# Patient Record
Sex: Male | Born: 1965
Health system: Southern US, Community
[De-identification: ages and names within clinical notes are randomized; demographics above are authoritative.]

---

## 2019-08-21 ENCOUNTER — Other Ambulatory Visit: Payer: Self-pay | Admitting: Family Medicine

## 2019-08-21 ENCOUNTER — Ambulatory Visit
Admission: RE | Admit: 2019-08-21 | Discharge: 2019-08-21 | Disposition: A | Discharge status: Home / Self Care | Payer: Managed Care, Other (non HMO) | Source: Ambulatory Visit | Attending: Family Medicine | Admitting: Family Medicine

## 2019-08-21 DIAGNOSIS — R0989 Other specified symptoms and signs involving the circulatory and respiratory systems: Secondary | ICD-10-CM

## 2020-09-06 DIAGNOSIS — Z0000 Encounter for general adult medical examination without abnormal findings: Secondary | ICD-10-CM | POA: Diagnosis not present

## 2020-09-06 DIAGNOSIS — Z125 Encounter for screening for malignant neoplasm of prostate: Secondary | ICD-10-CM | POA: Diagnosis not present

## 2020-09-06 DIAGNOSIS — Z13220 Encounter for screening for lipoid disorders: Secondary | ICD-10-CM | POA: Diagnosis not present

## 2020-09-06 DIAGNOSIS — Z8719 Personal history of other diseases of the digestive system: Secondary | ICD-10-CM | POA: Diagnosis not present

## 2020-09-06 DIAGNOSIS — R7309 Other abnormal glucose: Secondary | ICD-10-CM | POA: Diagnosis not present

## 2020-10-04 DIAGNOSIS — N401 Enlarged prostate with lower urinary tract symptoms: Secondary | ICD-10-CM | POA: Diagnosis not present

## 2020-10-04 DIAGNOSIS — R251 Tremor, unspecified: Secondary | ICD-10-CM | POA: Diagnosis not present

## 2020-10-04 DIAGNOSIS — N50819 Testicular pain, unspecified: Secondary | ICD-10-CM | POA: Diagnosis not present

## 2020-10-05 ENCOUNTER — Other Ambulatory Visit: Payer: Self-pay | Admitting: Family Medicine

## 2020-10-05 DIAGNOSIS — N50819 Testicular pain, unspecified: Secondary | ICD-10-CM

## 2020-10-13 ENCOUNTER — Ambulatory Visit: Payer: BC Managed Care – PPO | Admitting: Internal Medicine

## 2020-10-13 ENCOUNTER — Other Ambulatory Visit: Payer: Self-pay

## 2020-10-13 ENCOUNTER — Encounter: Payer: Self-pay | Admitting: Internal Medicine

## 2020-10-13 VITALS — BP 130/80 | HR 101 | Ht 71.0 in | Wt 173.5 lb

## 2020-10-13 DIAGNOSIS — E0590 Thyrotoxicosis, unspecified without thyrotoxic crisis or storm: Secondary | ICD-10-CM

## 2020-10-13 MED ORDER — METHIMAZOLE 10 MG PO TABS: 10.0000 mg | ORAL_TABLET | Freq: Every day | ORAL | 1 refills | Status: DC

## 2020-10-13 MED ORDER — ATENOLOL 25 MG PO TABS: 25.0000 mg | ORAL_TABLET | Freq: Every day | ORAL | 1 refills | Status: DC

## 2020-10-13 NOTE — Progress Notes (Signed)
Name: Marcus Shea  MRN/ DOB: [id number], [birth year]    Age/ Sex: 55 y.o., male    PCP: London Pepper, MD   Reason for Endocrinology Evaluation: Hyperthyroidism     Date of Initial Endocrinology Evaluation: 10/13/2020     HPI: [name] is a 55 y.o. male with unremarkable  past medical history. The patient presented for initial endocrinology clinic visit on 10/13/2020 for consultative assistance with his Hyperthyroidism  He has been diagnosed with hyperthyroidism in 09/2020 with a suppressed TSH < 0.01 uIU/mL an elevated FT4 at 2.99 ng/dL and T3 at 333 ng/dL , he was having tremors and diarrhea at the time.   He was being evaluated for pain in the testicles, currently on Cipro  He was also having SOB and having to catch his breath more often   Denies local neck swelling or pain NO recent URI symptoms   He noted weight loss over the past 6 months  Diarrhea has resolved  Continues with tremors  Has palpitations   Denies prior exposure to radiation  Was on MVI but stopped  Has eye allergies   Wife with Breast cancer , mother with metastatic breast ca  He is a Financial planner     HISTORY:   Past Medical History: No past medical history on file.  Past Surgical History:    Social History:  reports that he has never smoked. He has quit using smokeless tobacco. He reports current alcohol use.  Family History: family history is not on file.   HOME MEDICATIONS: Allergies as of 10/13/2020   No Known Allergies     Medication List       Accurate as of October 13, 2020 11:42 AM. If you have any questions, ask your nurse or doctor.        STOP taking these medications   predniSONE 5 MG tablet Commonly known as: DELTASONE Stopped by: Dorita Sciara, MD     TAKE these medications   ciprofloxacin 500 MG tablet Commonly known as: CIPRO Take 500 mg by mouth daily with breakfast.   naproxen 500 MG tablet Commonly known as: NAPROSYN Take 500 mg by  mouth 2 (two) times daily.         REVIEW OF SYSTEMS: A comprehensive ROS was conducted with the patient and is negative except as per HPI     OBJECTIVE:  VS: BP 130/80   Pulse (!) 101   Ht 5\' 11"  (1.803 m)   Wt 173 lb 8 oz (78.7 kg)   SpO2 98%   BMI 24.20 kg/m    Wt Readings from Last 3 Encounters:  10/13/20 173 lb 8 oz (78.7 kg)     EXAM: General: Pt appears well and is in NAD  Eyes: External eye exam normal without stare, lid lag or exophthalmos.  EOM intact.  Neck: General: Supple without adenopathy. Thyroid: Thyroid size normal.  No goiter or nodules appreciated. No thyroid bruit.  Lungs: Clear with good BS bilat with no rales, rhonchi, or wheezes  Heart: Auscultation: Tachycardic  Abdomen: Normoactive bowel sounds, soft, nontender, without masses or organomegaly palpable  Extremities:  BL LE: No pretibial edema normal ROM and strength.  Skin: Hair: Texture and amount normal with gender appropriate distribution Skin Inspection: No rashes Skin Palpation: Skin temperature, texture, and thickness normal to palpation  Neuro: Cranial nerves: II - XII grossly intact  Motor: Normal strength throughout DTRs: 2+ and symmetric in UE without delay in relaxation phase  Mental  Status: Judgment, insight: Intact Orientation: Oriented to time, place, and person Mood and affect: No depression, anxiety, or agitation     DATA REVIEWED: 10/05/2020 TSH < 0.01 uIU/mL  FT4 2.99 ng/dL T3 333 ng/dL  BUN/Cr 14/0.81 GFR 103 WBC 5.4 H/H 14.5/42.9 PLT 298 Neut 54.4    ASSESSMENT/PLAN/RECOMMENDATIONS:   1. Hyperthyroidism:  -Patient is clinically hyperthyroid -No local neck symptoms -We discussed differential diagnosis of Graves' disease, versus toxic multinodular goiter, versus subacute thyroiditis. -We discussed starting beta-blocker, he has no personal history of asthma -We discussed that Graves' Disease is a result of an autoimmune condition involving the thyroid.     We discussed with pt the benefits of methimazole in the Tx of hyperthyroidism, as well as the possible side effects/complications of anti-thyroid drug Tx (specifically detailing the rare, but serious side effect of agranulocytosis). He was informed of need for regular thyroid function monitoring while on methimazole to ensure appropriate dosage without over-treatment. As well, we discussed the possible side effects of methimazole including the chance of rash, the small chance of liver irritation/juandice and the <=1 in 300-400 chance of sudden onset agranulocytosis.  We discussed importance of going to ED promptly (and stopping methimazole) if hewere to develop significant fever with severe sore throat of other evidence of acute infection.       We extensively discussed the various treatment options for hyperthyroidism and Graves disease including ablation therapy with radioactive iodine versus antithyroid drug treatment versus surgical therapy.  We recommended to the patient that we felt, at this time, that thionamides therapy would be most optimal.  We discussed the various possible benefits versus side effects of the various therapies.   I carefully explained to the patient that one of the consequences of I-131 ablation treatment would likely be permanent hypothyroidism which would require long-term replacement therapy with LT4.   Medications : Start methimazole 10 mg, 1 tablet daily Start atenolol 25 mg 1 times daily    Follow-up in 3 months Labs in 6 weeks    Signed electronically by: Mack Guise, MD  [facility] Endocrinology  [facility] [address] [place], [place] [address] Phone: [phone] FAX: [phone]   CC: London Pepper, MD [place] [address] Phone: [phone] Fax: [phone]   Return to Endocrinology clinic as below: Future Appointments  Date Time Provider [place]   10/18/2020  2:00 PM [facility] [place] [facility]

## 2020-10-13 NOTE — Patient Instructions (Signed)
We recommend that you follow these hyperthyroidism instructions at home:  1) Take Methimazole 10 mg once  a day  If you develop severe sore throat with high fevers OR develop unexplained yellowing of your skin, eyes, under your tongue, severe abdominal pain with nausea or vomiting --> then please get evaluated immediately.  2) Atenolol 25 mg one times a day 3) Get repeat thyroid labs 6 weeks .   It is ESSENTIAL to get follow-up labs to help avoid over or undertreatment of your hyperthyroidism - both of which can be dangerous to your health.

## 2020-10-18 ENCOUNTER — Ambulatory Visit
Admission: RE | Admit: 2020-10-18 | Discharge: 2020-10-18 | Disposition: A | Discharge status: Home / Self Care | Payer: Managed Care, Other (non HMO) | Source: Ambulatory Visit | Attending: Family Medicine | Admitting: Family Medicine

## 2020-10-18 DIAGNOSIS — N50819 Testicular pain, unspecified: Secondary | ICD-10-CM

## 2020-11-26 ENCOUNTER — Other Ambulatory Visit: Payer: Self-pay

## 2020-11-26 ENCOUNTER — Other Ambulatory Visit (INDEPENDENT_AMBULATORY_CARE_PROVIDER_SITE_OTHER): Payer: BC Managed Care – PPO

## 2020-11-26 DIAGNOSIS — E0590 Thyrotoxicosis, unspecified without thyrotoxic crisis or storm: Secondary | ICD-10-CM

## 2020-11-26 LAB — T4, FREE: Free T4: 0.49 ng/dL — ABNORMAL LOW (ref 0.60–1.60)

## 2020-11-26 LAB — TSH: TSH: 0.05 u[IU]/mL — ABNORMAL LOW (ref 0.35–4.50)

## 2020-11-28 LAB — TRAB (TSH RECEPTOR BINDING ANTIBODY): TRAB: 19.14 IU/L — ABNORMAL HIGH (ref <=2.00)

## 2020-11-29 ENCOUNTER — Telehealth: Payer: Self-pay | Admitting: Internal Medicine

## 2020-11-29 NOTE — Telephone Encounter (Signed)
Please let the pt know that the cause of overactive thyroid is Graves' disease, which is an immune condition.    Please ask him to reduce Methimazole from one table to HALF a tablet from now on     Thanks   Benitez, MD  [facility] Endocrinology  [facility] [address] [place], [place] [address] Phone: [phone] FAX: [phone]

## 2020-11-29 NOTE — Telephone Encounter (Signed)
Spoken to patient and notified Dr Shamleffer's comments. Verbalized understanding.   

## 2020-11-30 DIAGNOSIS — D2930 Benign neoplasm of unspecified epididymis: Secondary | ICD-10-CM | POA: Diagnosis not present

## 2020-11-30 DIAGNOSIS — R102 Pelvic and perineal pain: Secondary | ICD-10-CM | POA: Diagnosis not present

## 2020-12-06 DIAGNOSIS — R102 Pelvic and perineal pain: Secondary | ICD-10-CM | POA: Diagnosis not present

## 2020-12-06 DIAGNOSIS — M6289 Other specified disorders of muscle: Secondary | ICD-10-CM | POA: Diagnosis not present

## 2020-12-06 DIAGNOSIS — M6281 Muscle weakness (generalized): Secondary | ICD-10-CM | POA: Diagnosis not present

## 2020-12-06 DIAGNOSIS — M62838 Other muscle spasm: Secondary | ICD-10-CM | POA: Diagnosis not present

## 2020-12-17 ENCOUNTER — Telehealth: Payer: Self-pay | Admitting: Internal Medicine

## 2020-12-17 DIAGNOSIS — M6289 Other specified disorders of muscle: Secondary | ICD-10-CM | POA: Diagnosis not present

## 2020-12-17 DIAGNOSIS — R102 Pelvic and perineal pain: Secondary | ICD-10-CM | POA: Diagnosis not present

## 2020-12-17 DIAGNOSIS — M62838 Other muscle spasm: Secondary | ICD-10-CM | POA: Diagnosis not present

## 2020-12-17 DIAGNOSIS — M6281 Muscle weakness (generalized): Secondary | ICD-10-CM | POA: Diagnosis not present

## 2020-12-17 MED ORDER — ATENOLOL 25 MG PO TABS: 25.0000 mg | ORAL_TABLET | Freq: Every day | ORAL | 1 refills | Status: DC

## 2020-12-17 MED ORDER — METHIMAZOLE 10 MG PO TABS: 5.0000 mg | ORAL_TABLET | Freq: Every day | ORAL | 1 refills | Status: DC

## 2020-12-17 NOTE — Telephone Encounter (Signed)
Refill sent.

## 2020-12-17 NOTE — Telephone Encounter (Signed)
Patient called to advise that his pharmacy preference is:  [facility]  [address]  Patient states needs new Methimazole RX (reflecting new dosage) and Atenolol sent to the new pharmacy

## 2021-01-19 ENCOUNTER — Other Ambulatory Visit: Payer: Self-pay

## 2021-01-19 ENCOUNTER — Ambulatory Visit: Payer: BC Managed Care – PPO | Admitting: Internal Medicine

## 2021-01-19 ENCOUNTER — Encounter: Payer: Self-pay | Admitting: Internal Medicine

## 2021-01-19 VITALS — BP 124/76 | HR 58 | Resp 96 | Ht 71.0 in | Wt 175.0 lb

## 2021-01-19 DIAGNOSIS — E0500 Thyrotoxicosis with diffuse goiter without thyrotoxic crisis or storm: Secondary | ICD-10-CM

## 2021-01-19 DIAGNOSIS — E0590 Thyrotoxicosis, unspecified without thyrotoxic crisis or storm: Secondary | ICD-10-CM

## 2021-01-19 NOTE — Progress Notes (Signed)
Name: Marcus Shea  MRN/ DOB: [id number], Oct 04, 1965    Age/ Sex: 55 y.o., male     PCP: London Pepper, MD   Reason for Endocrinology Evaluation: Hyperthyroidism     Initial Endocrinology Clinic Visit: 10/13/2020    PATIENT IDENTIFIER: [name] is a 55 y.o., male with a past medical history of Hyperthyroidism. He has followed with [place] [facility] since 10/13/2020 for consultative assistance with management of his Hyperthyroidism.   HISTORICAL SUMMARY:   He has been diagnosed with hyperthyroidism in 09/2020 with a suppressed TSH < 0.01 uIU/mL an elevated FT4 at 2.99 ng/dL and T3 at 333 ng/dL , he was having tremors and diarrhea at the time.    He was started on methimazole and atenolol in 09/2020 TR AB was elevated at 19.14 IU/L    Wife with Breast cancer , mother with metastatic breast ca   He is a Financial planner  SUBJECTIVE:    Today (01/19/2021):  Mr. [name] is here for a follow-up on hyperthyroidism .    Weight has been stable  Denies abdominal pain or vomiting  Denies constipation or diarrhea  Denies local neck swelling  Last eye exam was several years   Methimazole 10 mg, half a tablet daily  Atenolol 25 mg daily      HISTORY:  Past Medical History: No past medical history on file. Past Surgical History:  Social History:  reports that he has never smoked. He has quit using smokeless tobacco. He reports current alcohol use. No history on file for drug use. Family History:  Family History  Problem Relation Age of Onset   Breast cancer Mother    Diabetes Father      HOME MEDICATIONS: Allergies as of 01/19/2021   No Known Allergies      Medication List        Accurate as of January 19, 2021  4:14 PM. If you have any questions, ask your nurse or doctor.          atenolol 25 MG tablet Commonly known as: TENORMIN Take 1 tablet (25 mg total) by mouth daily.   ciprofloxacin 500 MG tablet Commonly known as: CIPRO Take 500 mg  by mouth daily with breakfast.   methimazole 10 MG tablet Commonly known as: TAPAZOLE Take 0.5 tablets (5 mg total) by mouth daily.   naproxen 500 MG tablet Commonly known as: NAPROSYN Take 500 mg by mouth 2 (two) times daily.          OBJECTIVE:   PHYSICAL EXAM: VS: BP 124/76   Pulse (!) 58   Resp (!) 96   Ht 5\' 11"  (1.803 m)   Wt 175 lb (79.4 kg)   BMI 24.41 kg/m    EXAM: General: Pt appears well and is in NAD  Eyes: External eye exam normal without stare, lid lag or exophthalmos.  EOM intact.  PERRL.  Neck: General: Supple without adenopathy. Thyroid: Thyroid size normal.  No goiter or nodules appreciated. No thyroid bruit.  Lungs: Clear with good BS bilat with no rales, rhonchi, or wheezes  Heart: Auscultation: RRR.  Abdomen: Normoactive bowel sounds, soft, nontender, without masses or organomegaly palpable  Extremities:  BL LE: No pretibial edema normal ROM and strength.  Mental Status: Judgment, insight: Intact Orientation: Oriented to time, place, and person Mood and affect: No depression, anxiety, or agitation     DATA REVIEWED:  Results for BIRD, SWETZ (MRN [id number]) as of 01/21/2021 09:12  Ref. Range 01/19/2021 16:36  Sodium Latest Ref Range: 135 - 145 mEq/L 138  Potassium Latest Ref Range: 3.5 - 5.1 mEq/L 4.3  Chloride Latest Ref Range: 96 - 112 mEq/L 102  CO2 Latest Ref Range: 19 - 32 mEq/L 28  Glucose Latest Ref Range: 70 - 99 mg/dL 80  BUN Latest Ref Range: 6 - 23 mg/dL 14  Creatinine Latest Ref Range: 0.40 - 1.50 mg/dL 1.07  Calcium Latest Ref Range: 8.4 - 10.5 mg/dL 9.6  Alkaline Phosphatase Latest Ref Range: 39 - 117 U/L 116  Albumin Latest Ref Range: 3.5 - 5.2 g/dL 4.6  AST Latest Ref Range: 0 - 37 U/L 23  ALT Latest Ref Range: 0 - 53 U/L 17  Total Protein Latest Ref Range: 6.0 - 8.3 g/dL 7.7  Total Bilirubin Latest Ref Range: 0.2 - 1.2 mg/dL 0.4  GFR Latest Ref Range: >60.00 mL/min 77.39  WBC Latest Ref Range: 4.0 - 10.5 K/uL 6.3  RBC  Latest Ref Range: 4.22 - 5.81 Mil/uL 4.96  Hemoglobin Latest Ref Range: 13.0 - 17.0 g/dL 15.5  HCT Latest Ref Range: 39.0 - 52.0 % 45.4  MCV Latest Ref Range: 78.0 - 100.0 fl 91.5  MCHC Latest Ref Range: 30.0 - 36.0 g/dL 34.1  RDW Latest Ref Range: 11.5 - 15.5 % 15.3  Platelets Latest Ref Range: 150.0 - 400.0 K/uL 270.0  Neutrophils Latest Ref Range: 43.0 - 77.0 % 58.7  Lymphocytes Latest Ref Range: 12.0 - 46.0 % 29.0  Monocytes Relative Latest Ref Range: 3.0 - 12.0 % 8.1  Eosinophil Latest Ref Range: 0.0 - 5.0 % 3.8  Basophil Latest Ref Range: 0.0 - 3.0 % 0.4  NEUT# Latest Ref Range: 1.4 - 7.7 K/uL 3.7  Lymphocyte # Latest Ref Range: 0.7 - 4.0 K/uL 1.8  Monocyte # Latest Ref Range: 0.1 - 1.0 K/uL 0.5  Eosinophils Absolute Latest Ref Range: 0.0 - 0.7 K/uL 0.2  Basophils Absolute Latest Ref Range: 0.0 - 0.1 K/uL 0.0  TSH Latest Ref Range: 0.35 - 5.50 uIU/mL 4.81  T4,Free(Direct) Latest Ref Range: 0.60 - 1.60 ng/dL 0.55 (L)     ASSESSMENT / PLAN / RECOMMENDATIONS:   Hyperthyroidism Secondary to Graves' Disease   - Pt is clinically euthyroid except for the occasional "jittery" sensation that he has noted since reducing methimazole dose - Biochemically he is hypothyroid, will reduce the methimazole dose as below   Medications  Stop Methimazole 10 mg  Start Methimazole 5 mg, take 1 tablet Monday through Friday and half a tablet on Saturday and Sundays  Stop Atenolol 25 mg daily    2. Graves' Disease:   - No extra thyroidal manifestations of Graves' disease   Addendum: discussed lab results with pt on 01/21/2021 at 9:15 am     Signed electronically by: Mack Guise, MD  [facility] Endocrinology  [facility] [address] [place], [place] [address] Phone: [phone] FAX: [phone]      CC: London Pepper, MD [address] [place] [address] Phone: [phone]  Fax: [phone]   Return to  Endocrinology clinic as below: No future appointments.

## 2021-01-20 LAB — COMPREHENSIVE METABOLIC PANEL
ALT: 17 U/L (ref 0–53)
AST: 23 U/L (ref 0–37)
Albumin: 4.6 g/dL (ref 3.5–5.2)
Alkaline Phosphatase: 116 U/L (ref 39–117)
BUN: 14 mg/dL (ref 6–23)
CO2: 28 mEq/L (ref 19–32)
Calcium: 9.6 mg/dL (ref 8.4–10.5)
Chloride: 102 mEq/L (ref 96–112)
Creatinine, Ser: 1.07 mg/dL (ref 0.40–1.50)
GFR: 77.39 mL/min (ref >60.00)
Glucose, Bld: 80 mg/dL (ref 70–99)
Potassium: 4.3 mEq/L (ref 3.5–5.1)
Sodium: 138 mEq/L (ref 135–145)
Total Bilirubin: 0.4 mg/dL (ref 0.2–1.2)
Total Protein: 7.7 g/dL (ref 6.0–8.3)

## 2021-01-20 LAB — CBC WITH DIFFERENTIAL/PLATELET
Basophils Absolute: 0 10*3/uL (ref 0.0–0.1)
Basophils Relative: 0.4 % (ref 0.0–3.0)
Eosinophils Absolute: 0.2 10*3/uL (ref 0.0–0.7)
Eosinophils Relative: 3.8 % (ref 0.0–5.0)
HCT: 45.4 % (ref 39.0–52.0)
Hemoglobin: 15.5 g/dL (ref 13.0–17.0)
Lymphocytes Relative: 29 % (ref 12.0–46.0)
Lymphs Abs: 1.8 10*3/uL (ref 0.7–4.0)
MCHC: 34.1 g/dL (ref 30.0–36.0)
MCV: 91.5 fl (ref 78.0–100.0)
Monocytes Absolute: 0.5 10*3/uL (ref 0.1–1.0)
Monocytes Relative: 8.1 % (ref 3.0–12.0)
Neutro Abs: 3.7 10*3/uL (ref 1.4–7.7)
Neutrophils Relative %: 58.7 % (ref 43.0–77.0)
Platelets: 270 10*3/uL (ref 150.0–400.0)
RBC: 4.96 Mil/uL (ref 4.22–5.81)
RDW: 15.3 % (ref 11.5–15.5)
WBC: 6.3 10*3/uL (ref 4.0–10.5)

## 2021-01-20 LAB — TSH: TSH: 4.81 u[IU]/mL (ref 0.35–5.50)

## 2021-01-20 LAB — T4, FREE: Free T4: 0.55 ng/dL — ABNORMAL LOW (ref 0.60–1.60)

## 2021-01-21 MED ORDER — METHIMAZOLE 5 MG PO TABS: 5.0000 mg | ORAL_TABLET | ORAL | 1 refills | Status: DC

## 2021-01-26 ENCOUNTER — Other Ambulatory Visit: Payer: Self-pay | Admitting: Urology

## 2021-01-26 DIAGNOSIS — D2930 Benign neoplasm of unspecified epididymis: Secondary | ICD-10-CM

## 2021-02-09 ENCOUNTER — Ambulatory Visit
Admission: RE | Admit: 2021-02-09 | Discharge: 2021-02-09 | Disposition: A | Discharge status: Home / Self Care | Payer: BC Managed Care – PPO | Source: Ambulatory Visit | Attending: Urology | Admitting: Urology

## 2021-02-09 ENCOUNTER — Other Ambulatory Visit: Payer: Self-pay

## 2021-02-09 DIAGNOSIS — D2930 Benign neoplasm of unspecified epididymis: Secondary | ICD-10-CM

## 2021-02-09 DIAGNOSIS — D294 Benign neoplasm of scrotum: Secondary | ICD-10-CM | POA: Diagnosis not present

## 2021-02-09 DIAGNOSIS — D2931 Benign neoplasm of right epididymis: Secondary | ICD-10-CM | POA: Diagnosis not present

## 2021-03-07 DIAGNOSIS — L738 Other specified follicular disorders: Secondary | ICD-10-CM | POA: Diagnosis not present

## 2021-03-07 DIAGNOSIS — L821 Other seborrheic keratosis: Secondary | ICD-10-CM | POA: Diagnosis not present

## 2021-03-07 DIAGNOSIS — L570 Actinic keratosis: Secondary | ICD-10-CM | POA: Diagnosis not present

## 2021-03-25 ENCOUNTER — Telehealth: Payer: Self-pay | Admitting: Internal Medicine

## 2021-03-25 ENCOUNTER — Other Ambulatory Visit (INDEPENDENT_AMBULATORY_CARE_PROVIDER_SITE_OTHER): Payer: BC Managed Care – PPO

## 2021-03-25 ENCOUNTER — Other Ambulatory Visit: Payer: Self-pay

## 2021-03-25 DIAGNOSIS — E0590 Thyrotoxicosis, unspecified without thyrotoxic crisis or storm: Secondary | ICD-10-CM

## 2021-03-25 LAB — TSH: TSH: 10.2 u[IU]/mL — ABNORMAL HIGH (ref 0.35–5.50)

## 2021-03-25 LAB — T4, FREE: Free T4: 0.53 ng/dL — ABNORMAL LOW (ref 0.60–1.60)

## 2021-03-25 MED ORDER — METHIMAZOLE 5 MG PO TABS: 5.0000 mg | ORAL_TABLET | ORAL | 6 refills | Status: DC

## 2021-03-25 NOTE — Telephone Encounter (Signed)
Results for HALLETT, SOTER (MRN [id number]) as of 03/25/2021 17:35  Ref. Range 03/25/2021 15:00  TSH Latest Ref Range: 0.35 - 5.50 uIU/mL 10.20 (H)  T4,Free(Direct) Latest Ref Range: 0.60 - 1.60 ng/dL 0.53 (L)    Discussed lab results with the patient, he is now hypothyroid, we will reduce his methimazole as below     Methimazole 5 mg half a tablet Monday through Friday.  Skip Saturday and Sunday   Patient expressed understanding   Abby Nena Jordan, MD  [facility] Endocrinology  [facility] [address] [place], [place] [address] Phone: [phone] FAX: [phone]

## 2021-05-25 ENCOUNTER — Ambulatory Visit (INDEPENDENT_AMBULATORY_CARE_PROVIDER_SITE_OTHER): Payer: BC Managed Care – PPO | Admitting: Internal Medicine

## 2021-05-25 ENCOUNTER — Other Ambulatory Visit: Payer: Self-pay

## 2021-05-25 ENCOUNTER — Encounter: Payer: Self-pay | Admitting: Internal Medicine

## 2021-05-25 VITALS — BP 120/84 | HR 68 | Ht 71.0 in | Wt 169.2 lb

## 2021-05-25 DIAGNOSIS — E0500 Thyrotoxicosis with diffuse goiter without thyrotoxic crisis or storm: Secondary | ICD-10-CM | POA: Diagnosis not present

## 2021-05-25 DIAGNOSIS — E0590 Thyrotoxicosis, unspecified without thyrotoxic crisis or storm: Secondary | ICD-10-CM | POA: Diagnosis not present

## 2021-05-25 DIAGNOSIS — Z23 Encounter for immunization: Secondary | ICD-10-CM

## 2021-05-25 NOTE — Progress Notes (Signed)
Name: Marcus Shea  MRN/ DOB: [id number], 1966/02/17    Age/ Sex: 55 y.o., male     PCP: London Pepper, MD   Reason for Endocrinology Evaluation: Hyperthyroidism     Initial Endocrinology Clinic Visit: 10/13/2020    PATIENT IDENTIFIER: [name] is a 55 y.o., male with a past medical history of Hyperthyroidism. He has followed with [facility] since 10/13/2020 for consultative assistance with management of his Hyperthyroidism.   HISTORICAL SUMMARY:   He has been diagnosed with hyperthyroidism in 09/2020 with a suppressed TSH < 0.01 uIU/mL an elevated FT4 at 2.99 ng/dL and T3 at 333 ng/dL , he was having tremors and diarrhea at the time.    He was started on methimazole and atenolol in 09/2020 TRAb  was elevated at 19.14 IU/L    Wife with Breast cancer , mother with metastatic breast ca   He is a Financial planner  SUBJECTIVE:    Today (05/25/2021):  [name] is here for a follow-up on hyperthyroidism .    He has been noted with weight loss - intentional through fasting  Denies abdominal pain or vomiting  Denies constipation or diarrhea  Denies local neck swelling  Denies palpitations  Burning or itching of the eyes that he attributes to allergies    Methimazole 5 mg, half a tablet Monday through Friday     HISTORY:  Past Medical History: No past medical history on file. Past Surgical History:  Social History:  reports that he has never smoked. He has quit using smokeless tobacco. He reports current alcohol use. No history on file for drug use. Family History:  Family History  Problem Relation Age of Onset   Breast cancer Mother    Diabetes Father      HOME MEDICATIONS: Allergies as of 05/25/2021   No Known Allergies      Medication List        Accurate as of May 25, 2021  3:37 PM. If you have any questions, ask your nurse or doctor.          STOP taking these medications    ciprofloxacin 500 MG tablet Commonly  known as: CIPRO Stopped by: Dorita Sciara, MD       TAKE these medications    methimazole 5 MG tablet Commonly known as: TAPAZOLE Take 1 tablet (5 mg total) by mouth as directed. Half a tablet  Monday through Friday, one on weekend   naproxen 500 MG tablet Commonly known as: NAPROSYN Take 500 mg by mouth 2 (two) times daily.          OBJECTIVE:   PHYSICAL EXAM: VS: BP 120/84 (BP Location: Left Arm, Patient Position: Sitting, Cuff Size: Small)   Pulse 68   Ht 5\' 11"  (1.803 m)   Wt 169 lb 3.2 oz (76.7 kg)   SpO2 99%   BMI 23.60 kg/m    EXAM: General: Pt appears well and is in NAD  Eyes: External eye exam normal without stare, lid lag or exophthalmos.  EOM intact.  PERRL.  Neck: General: Supple without adenopathy. Thyroid: Thyroid size normal.  No goiter or nodules appreciated. No thyroid bruit.  Lungs: Clear with good BS bilat with no rales, rhonchi, or wheezes  Heart: Auscultation: RRR.  Abdomen: Normoactive bowel sounds, soft, nontender, without masses or organomegaly palpable  Extremities:  BL LE: No pretibial edema normal ROM and strength.  Mental Status: Judgment, insight: Intact Orientation: Oriented to time, place, and person Mood and  affect: No depression, anxiety, or agitation     DATA REVIEWED:  Results for Marcus, ROUT (MRN [id number]) as of 05/27/2021 12:21  Ref. Range 05/25/2021 15:54  TSH Latest Ref Range: 0.35 - 5.50 uIU/mL 0.22 (L)  T4,Free(Direct) Latest Ref Range: 0.60 - 1.60 ng/dL 1.07     Results for Marcus, Shea (MRN [id number]) as of 01/21/2021 09:12  Ref. Range 01/19/2021 16:36  Sodium Latest Ref Range: 135 - 145 mEq/L 138  Potassium Latest Ref Range: 3.5 - 5.1 mEq/L 4.3  Chloride Latest Ref Range: 96 - 112 mEq/L 102  CO2 Latest Ref Range: 19 - 32 mEq/L 28  Glucose Latest Ref Range: 70 - 99 mg/dL 80  BUN Latest Ref Range: 6 - 23 mg/dL 14  Creatinine Latest Ref Range: 0.40 - 1.50 mg/dL 1.07  Calcium Latest Ref Range: 8.4 -  10.5 mg/dL 9.6  Alkaline Phosphatase Latest Ref Range: 39 - 117 U/L 116  Albumin Latest Ref Range: 3.5 - 5.2 g/dL 4.6  AST Latest Ref Range: 0 - 37 U/L 23  ALT Latest Ref Range: 0 - 53 U/L 17  Total Protein Latest Ref Range: 6.0 - 8.3 g/dL 7.7  Total Bilirubin Latest Ref Range: 0.2 - 1.2 mg/dL 0.4  GFR Latest Ref Range: >60.00 mL/min 77.39  WBC Latest Ref Range: 4.0 - 10.5 K/uL 6.3  RBC Latest Ref Range: 4.22 - 5.81 Mil/uL 4.96  Hemoglobin Latest Ref Range: 13.0 - 17.0 g/dL 15.5  HCT Latest Ref Range: 39.0 - 52.0 % 45.4  MCV Latest Ref Range: 78.0 - 100.0 fl 91.5  MCHC Latest Ref Range: 30.0 - 36.0 g/dL 34.1  RDW Latest Ref Range: 11.5 - 15.5 % 15.3  Platelets Latest Ref Range: 150.0 - 400.0 K/uL 270.0  Neutrophils Latest Ref Range: 43.0 - 77.0 % 58.7  Lymphocytes Latest Ref Range: 12.0 - 46.0 % 29.0  Monocytes Relative Latest Ref Range: 3.0 - 12.0 % 8.1  Eosinophil Latest Ref Range: 0.0 - 5.0 % 3.8  Basophil Latest Ref Range: 0.0 - 3.0 % 0.4  NEUT# Latest Ref Range: 1.4 - 7.7 K/uL 3.7  Lymphocyte # Latest Ref Range: 0.7 - 4.0 K/uL 1.8  Monocyte # Latest Ref Range: 0.1 - 1.0 K/uL 0.5  Eosinophils Absolute Latest Ref Range: 0.0 - 0.7 K/uL 0.2  Basophils Absolute Latest Ref Range: 0.0 - 0.1 K/uL 0.0  TSH Latest Ref Range: 0.35 - 5.50 uIU/mL 4.81  T4,Free(Direct) Latest Ref Range: 0.60 - 1.60 ng/dL 0.55 (L)     ASSESSMENT / PLAN / RECOMMENDATIONS:   Hyperthyroidism Secondary to Graves' Disease   - Pt is clinically euthyroid except for the occasional "jittery" sensation that he has noted since reducing methimazole dose - Biochemically he is hypothyroid, will reduce the methimazole dose as below   Medications   Increase  Methimazole 5 mg, take 1 tablet Monday through Saturday  ( skip sundays)    2. Graves' Disease:   - No extra thyroidal manifestations of Graves' disease     Signed electronically by: Mack Guise, MD  [facility] Endocrinology  [facility] [address] [place],  [address] Phone: [phone] FAX: [phone]      CC: London Pepper, MD [address] [place] [address] Phone: [phone]  Fax: [phone]   Return to Endocrinology clinic as below: No future appointments.

## 2021-05-26 LAB — T4, FREE: Free T4: 1.07 ng/dL (ref 0.60–1.60)

## 2021-05-26 LAB — TSH: TSH: 0.22 u[IU]/mL — ABNORMAL LOW (ref 0.35–5.50)

## 2021-05-27 ENCOUNTER — Telehealth: Payer: Self-pay | Admitting: Internal Medicine

## 2021-05-27 MED ORDER — METHIMAZOLE 5 MG PO TABS: 5.0000 mg | ORAL_TABLET | ORAL | 2 refills | Status: DC

## 2021-05-27 NOTE — Telephone Encounter (Signed)
Please let the pt know that his thyroid is now slightly overactive    Currently he is on half a tablet of methimazole 5 mg MOnday through Friday  ( skipping Saturday and Sunday, he will need to increase 6 days a week   Half a tablet MOnday through Saturday ( skips sundays only )   Thanks

## 2021-05-27 NOTE — Telephone Encounter (Signed)
Patient has been notified and verbalized understanding 

## 2021-06-03 DIAGNOSIS — Z1211 Encounter for screening for malignant neoplasm of colon: Secondary | ICD-10-CM | POA: Diagnosis not present

## 2021-06-03 DIAGNOSIS — K648 Other hemorrhoids: Secondary | ICD-10-CM | POA: Diagnosis not present

## 2021-06-03 DIAGNOSIS — K5730 Diverticulosis of large intestine without perforation or abscess without bleeding: Secondary | ICD-10-CM | POA: Diagnosis not present

## 2021-07-17 ENCOUNTER — Other Ambulatory Visit: Payer: Self-pay | Admitting: Internal Medicine

## 2021-08-25 ENCOUNTER — Other Ambulatory Visit (INDEPENDENT_AMBULATORY_CARE_PROVIDER_SITE_OTHER): Payer: BC Managed Care – PPO

## 2021-08-25 ENCOUNTER — Other Ambulatory Visit: Payer: Self-pay

## 2021-08-25 DIAGNOSIS — E0590 Thyrotoxicosis, unspecified without thyrotoxic crisis or storm: Secondary | ICD-10-CM | POA: Diagnosis not present

## 2021-08-26 ENCOUNTER — Telehealth: Payer: Self-pay | Admitting: Internal Medicine

## 2021-08-26 LAB — T4, FREE: Free T4: 0.65 ng/dL (ref 0.60–1.60)

## 2021-08-26 LAB — TSH: TSH: 6.53 u[IU]/mL — ABNORMAL HIGH (ref 0.35–5.50)

## 2021-08-26 NOTE — Telephone Encounter (Signed)
Patient has verified that he is taking half a tablet of Methimazole 6 days a week with skipping Sundays. Also advised to reduce Methimazole half three days a week with skipping the rest of the week. Patient expressed his understanding.

## 2021-08-26 NOTE — Telephone Encounter (Signed)
Please verify that the pt has been taking Half a tablet of Methimazole  6 days a week ( Monday through Saturday - skips sundays ). If so ,    Please ask him to REDUCE Methimazole Half THREE days a week ( for example Monday, Wednesday and Friday ) skip the rest of the week     Thanks

## 2021-09-15 DIAGNOSIS — Z23 Encounter for immunization: Secondary | ICD-10-CM | POA: Diagnosis not present

## 2021-09-15 DIAGNOSIS — Z8719 Personal history of other diseases of the digestive system: Secondary | ICD-10-CM | POA: Diagnosis not present

## 2021-09-15 DIAGNOSIS — Z13220 Encounter for screening for lipoid disorders: Secondary | ICD-10-CM | POA: Diagnosis not present

## 2021-09-15 DIAGNOSIS — R7309 Other abnormal glucose: Secondary | ICD-10-CM | POA: Diagnosis not present

## 2021-09-15 DIAGNOSIS — Z0000 Encounter for general adult medical examination without abnormal findings: Secondary | ICD-10-CM | POA: Diagnosis not present

## 2021-09-15 DIAGNOSIS — Z125 Encounter for screening for malignant neoplasm of prostate: Secondary | ICD-10-CM | POA: Diagnosis not present

## 2021-09-20 DIAGNOSIS — H02831 Dermatochalasis of right upper eyelid: Secondary | ICD-10-CM | POA: Diagnosis not present

## 2021-09-20 DIAGNOSIS — H524 Presbyopia: Secondary | ICD-10-CM | POA: Diagnosis not present

## 2021-09-20 DIAGNOSIS — H02834 Dermatochalasis of left upper eyelid: Secondary | ICD-10-CM | POA: Diagnosis not present

## 2021-11-23 ENCOUNTER — Ambulatory Visit: Payer: BC Managed Care – PPO | Admitting: Internal Medicine

## 2021-11-24 ENCOUNTER — Encounter: Payer: Self-pay | Admitting: Internal Medicine

## 2021-11-24 ENCOUNTER — Ambulatory Visit: Payer: BC Managed Care – PPO | Admitting: Internal Medicine

## 2021-11-24 VITALS — BP 118/70 | HR 65 | Ht 71.0 in | Wt 165.3 lb

## 2021-11-24 DIAGNOSIS — E0590 Thyrotoxicosis, unspecified without thyrotoxic crisis or storm: Secondary | ICD-10-CM

## 2021-11-24 DIAGNOSIS — E0500 Thyrotoxicosis with diffuse goiter without thyrotoxic crisis or storm: Secondary | ICD-10-CM | POA: Diagnosis not present

## 2021-11-24 LAB — CBC WITH DIFFERENTIAL/PLATELET
Basophils Absolute: 0 10*3/uL (ref 0.0–0.1)
Basophils Relative: 0.5 % (ref 0.0–3.0)
Eosinophils Absolute: 0.4 10*3/uL (ref 0.0–0.7)
Eosinophils Relative: 6.4 % — ABNORMAL HIGH (ref 0.0–5.0)
HCT: 44.1 % (ref 39.0–52.0)
Hemoglobin: 14.8 g/dL (ref 13.0–17.0)
Lymphocytes Relative: 30.7 % (ref 12.0–46.0)
Lymphs Abs: 2.1 10*3/uL (ref 0.7–4.0)
MCHC: 33.5 g/dL (ref 30.0–36.0)
MCV: 94.1 fl (ref 78.0–100.0)
Monocytes Absolute: 0.5 10*3/uL (ref 0.1–1.0)
Monocytes Relative: 7 % (ref 3.0–12.0)
Neutro Abs: 3.7 10*3/uL (ref 1.4–7.7)
Neutrophils Relative %: 55.4 % (ref 43.0–77.0)
Platelets: 250 10*3/uL (ref 150.0–400.0)
RBC: 4.68 Mil/uL (ref 4.22–5.81)
RDW: 12.8 % (ref 11.5–15.5)
WBC: 6.7 10*3/uL (ref 4.0–10.5)

## 2021-11-24 LAB — TSH: TSH: 1.83 u[IU]/mL (ref 0.35–5.50)

## 2021-11-24 LAB — COMPREHENSIVE METABOLIC PANEL
ALT: 18 U/L (ref 0–53)
AST: 23 U/L (ref 0–37)
Albumin: 4.6 g/dL (ref 3.5–5.2)
Alkaline Phosphatase: 92 U/L (ref 39–117)
BUN: 14 mg/dL (ref 6–23)
CO2: 28 mEq/L (ref 19–32)
Calcium: 9.4 mg/dL (ref 8.4–10.5)
Chloride: 103 mEq/L (ref 96–112)
Creatinine, Ser: 1.16 mg/dL (ref 0.40–1.50)
GFR: 69.83 mL/min (ref >60.00)
Glucose, Bld: 85 mg/dL (ref 70–99)
Potassium: 4.2 mEq/L (ref 3.5–5.1)
Sodium: 139 mEq/L (ref 135–145)
Total Bilirubin: 0.6 mg/dL (ref 0.2–1.2)
Total Protein: 7.2 g/dL (ref 6.0–8.3)

## 2021-11-24 LAB — T4, FREE: Free T4: 0.9 ng/dL (ref 0.60–1.60)

## 2021-11-24 LAB — T3: T3, Total: 79 ng/dL (ref 76–181)

## 2021-11-24 NOTE — Progress Notes (Signed)
? ?Name: Joie Reamer  ?MRN/ DOB: [id number], December 23, 1965    ?Age/ Sex: 56 y.o., male   ? ? ?PCP: London Pepper, MD   ?Reason for Endocrinology Evaluation: Hyperthyroidism  ?   ?Initial Endocrinology Clinic Visit: 10/13/2020  ? ? ?PATIENT IDENTIFIER: Mr. Madison Albea is a 56 y.o., male with a past medical history of Hyperthyroidism. He has followed with [facility] since 10/13/2020 for consultative assistance with management of his Hyperthyroidism.  ? ?HISTORICAL SUMMARY:  ? ?He has been diagnosed with hyperthyroidism in 09/2020 with a suppressed TSH < 0.01 uIU/mL an elevated FT4 at 2.99 ng/dL and T3 at 333 ng/dL , he was having tremors and diarrhea at the time.  ? ? ?He was started on methimazole and atenolol in 09/2020 ?TRAb  was elevated at 19.14 IU/L ? ? ? ?Wife with Breast cancer , mother with metastatic breast ca ?  ?He is a Financial planner ? ?SUBJECTIVE:  ? ? ?Today (11/24/2021):  Mr. Haycraft is here for a follow-up on hyperthyroidism .  ? ? ?He has been noted with weight loss - intentional through fasting  ?Denies constipation or diarrhea  ?Denies local neck swelling  ?Denies palpitations  ?Denies tremors except last week  ?Has burning and itching of the eyes that he attributes to allergies  ? ?Last eye exam 10/2021 ? ? ? ?Methimazole 5 mg, half a tablet three days a week ( Monday Wednesday and Friday ) ? ? ? ?HISTORY:  ?Past Medical History: No past medical history on file. ?Past Surgical History:  ?Social History:  reports that he has never smoked. He has quit using smokeless tobacco. He reports current alcohol use. No history on file for drug use. ?Family History:  ?Family History  ?Problem Relation Age of Onset  ? Breast cancer Mother   ? Diabetes Father   ? ? ? ?HOME MEDICATIONS: ?Allergies as of 11/24/2021   ?No Known Allergies ?  ? ?  ?Medication List  ?  ? ?  ? Accurate as of Nov 24, 2021  3:01 PM. If you have any questions, ask your nurse or doctor.  ?  ?  ? ?  ? ?methimazole 5 MG  tablet ?Commonly known as: TAPAZOLE ?TAKE 5 MG MONDAY THROUGH FRIDAY, 2.5 MG ON SATURDAY AND SUNDAYS *STOP '10MG'$  TABS ?What changed: See the new instructions. ?  ?naproxen 500 MG tablet ?Commonly known as: NAPROSYN ?Take 500 mg by mouth 2 (two) times daily. ?  ? ?  ? ? ? ? ?OBJECTIVE:  ? ?PHYSICAL EXAM: ?VS: BP 118/70 (BP Location: Left Arm, Patient Position: Sitting, Cuff Size: Small)   Pulse 65   Ht '5\' 11"'$  (1.803 m)   Wt 165 lb 4.8 oz (75 kg)   SpO2 99%   BMI 23.05 kg/m?   ? ?EXAM: ?General: Pt appears well and is in NAD  ?Eyes: External eye exam normal without stare, lid lag or exophthalmos.  EOM intact.  PERRL.  ?Neck: General: Supple without adenopathy. ?Thyroid: Thyroid size normal.  No goiter or nodules appreciated. No thyroid bruit.  ?Lungs: Clear with good BS bilat with no rales, rhonchi, or wheezes  ?Heart: Auscultation: RRR.  ?Abdomen: Normoactive bowel sounds, soft, nontender, without masses or organomegaly palpable  ?Extremities:  ?BL LE: No pretibial edema normal ROM and strength.  ?Mental Status: Judgment, insight: Intact ?Orientation: Oriented to time, place, and person ?Mood and affect: No depression, anxiety, or agitation  ? ? ? ?DATA REVIEWED: ? Latest Reference Range & Units 11/24/21  15:12  ?Sodium 135 - 145 mEq/L 139  ?Potassium 3.5 - 5.1 mEq/L 4.2  ?Chloride 96 - 112 mEq/L 103  ?CO2 19 - 32 mEq/L 28  ?Glucose 70 - 99 mg/dL 85  ?BUN 6 - 23 mg/dL 14  ?Creatinine 0.40 - 1.50 mg/dL 1.16  ?Calcium 8.4 - 10.5 mg/dL 9.4  ?Alkaline Phosphatase 39 - 117 U/L 92  ?Albumin 3.5 - 5.2 g/dL 4.6  ?AST 0 - 37 U/L 23  ?ALT 0 - 53 U/L 18  ?Total Protein 6.0 - 8.3 g/dL 7.2  ?Total Bilirubin 0.2 - 1.2 mg/dL 0.6  ?GFR >60.00 mL/min 69.83  ?WBC 4.0 - 10.5 K/uL 6.7  ?RBC 4.22 - 5.81 Mil/uL 4.68  ?Hemoglobin 13.0 - 17.0 g/dL 14.8  ?HCT 39.0 - 52.0 % 44.1  ?MCV 78.0 - 100.0 fl 94.1  ?MCHC 30.0 - 36.0 g/dL 33.5  ?RDW 11.5 - 15.5 % 12.8  ?Platelets 150.0 - 400.0 K/uL 250.0  ?Neutrophils 43.0 - 77.0 % 55.4   ?Lymphocytes 12.0 - 46.0 % 30.7  ?Monocytes Relative 3.0 - 12.0 % 7.0  ?Eosinophil 0.0 - 5.0 % 6.4 (H)  ?Basophil 0.0 - 3.0 % 0.5  ?NEUT# 1.4 - 7.7 K/uL 3.7  ?Lymphocyte # 0.7 - 4.0 K/uL 2.1  ?Monocyte # 0.1 - 1.0 K/uL 0.5  ?Eosinophils Absolute 0.0 - 0.7 K/uL 0.4  ?Basophils Absolute 0.0 - 0.1 K/uL 0.0  ? ? Latest Reference Range & Units 11/24/21 15:12  ?TSH 0.35 - 5.50 uIU/mL 1.83  ?Triiodothyronine (T3) 76 - 181 ng/dL 79  ?T4,Free(Direct) 0.60 - 1.60 ng/dL 0.90  ? ? ? ? ? ?ASSESSMENT / PLAN / RECOMMENDATIONS:  ? ?Hyperthyroidism Secondary to Graves' Disease ? ? ?- Pt is clinically euthyroid ?- No local neck symptoms  ?- TFT;s normal  ? ?Medications  ? ?Continue  Methimazole 5 mg,half a tablet ( Monday Wednesday and Friday ) ? ? ? ?2. Graves' Disease: ? ? ?- No extra thyroidal manifestations of Graves' disease ? ? ?F/u 6 months  ? ? ?Signed electronically by: ?Abby Nena Jordan, MD ? ?[facility]  ?[facility] ?[address] ?[place], [place] [address] ?Phone: [phone] ?FAX: [phone]  ? ? ? ? ?CC: ?London Pepper, MD ?[address] ?[place] [address] ?Phone: [phone]  ?Fax: [phone] ? ? ?Return to Endocrinology clinic as below: ?No future appointments. ? ? ?  ? ?

## 2021-11-25 ENCOUNTER — Encounter: Payer: Self-pay | Admitting: Internal Medicine

## 2021-11-25 MED ORDER — METHIMAZOLE 5 MG PO TABS: 2.5000 mg | ORAL_TABLET | ORAL | 3 refills | Status: DC

## 2021-11-27 IMAGING — DX DG CHEST 2V
2 series · 2 of 2 positions shown · non-contrast
Comparison: None.

CLINICAL DATA: Chest congestion.

EXAM:
CHEST - 2 VIEW

[dg chest 2 view (1 of 2)]
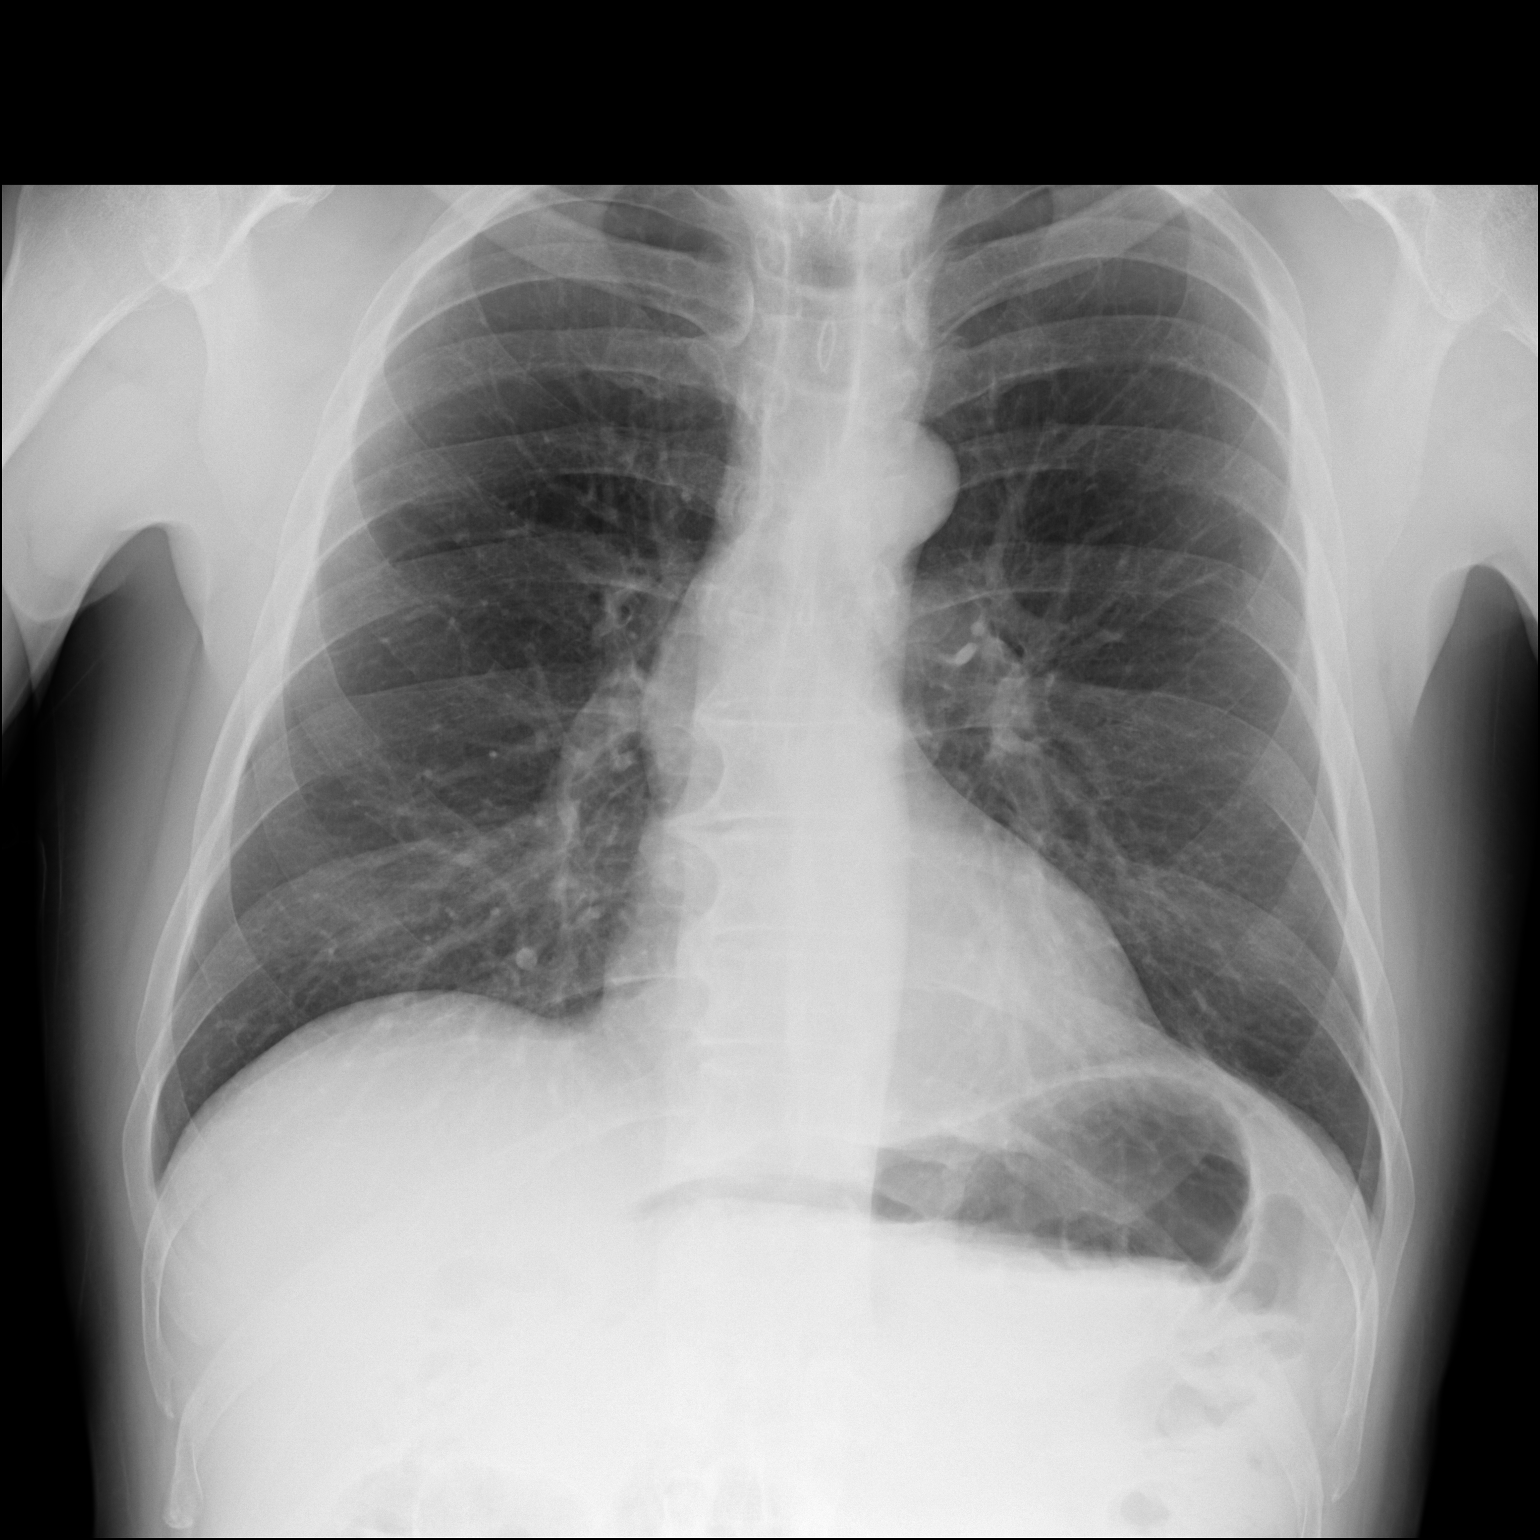

[dg chest 2 view (2 of 2)]
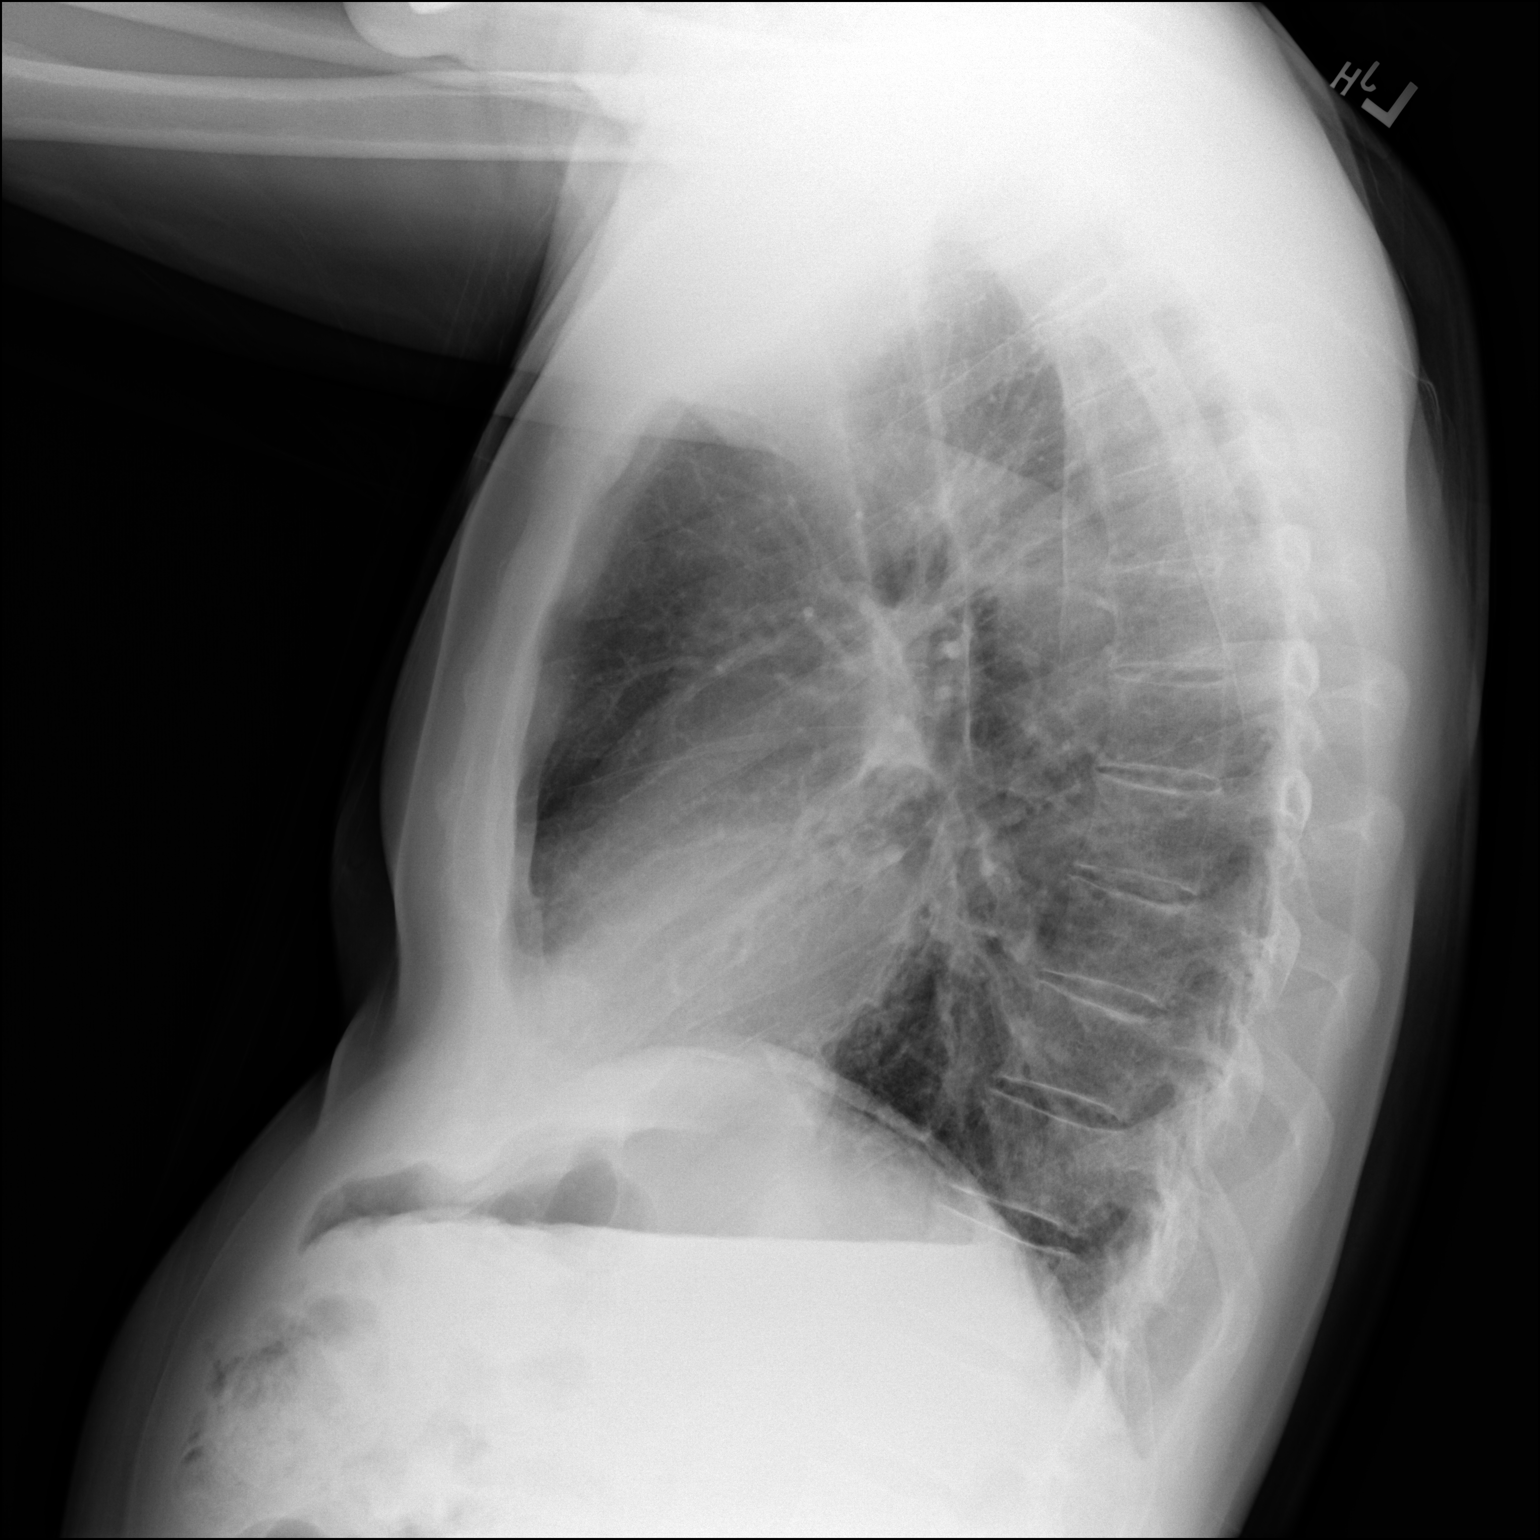

[2 of 2 positions shown; findings below may reference images not displayed]

FINDINGS: The heart size and mediastinal contours are within normal limits.
Both lungs are clear. The visualized skeletal structures are
unremarkable. There is a probable calcified granuloma at the right
lung base.
IMPRESSION: No active cardiopulmonary disease.

## 2021-12-16 DIAGNOSIS — Z23 Encounter for immunization: Secondary | ICD-10-CM | POA: Diagnosis not present

## 2021-12-19 DIAGNOSIS — L578 Other skin changes due to chronic exposure to nonionizing radiation: Secondary | ICD-10-CM | POA: Diagnosis not present

## 2021-12-19 DIAGNOSIS — D1801 Hemangioma of skin and subcutaneous tissue: Secondary | ICD-10-CM | POA: Diagnosis not present

## 2021-12-19 DIAGNOSIS — L570 Actinic keratosis: Secondary | ICD-10-CM | POA: Diagnosis not present

## 2021-12-19 DIAGNOSIS — L821 Other seborrheic keratosis: Secondary | ICD-10-CM | POA: Diagnosis not present

## 2022-02-10 DIAGNOSIS — Z125 Encounter for screening for malignant neoplasm of prostate: Secondary | ICD-10-CM | POA: Diagnosis not present

## 2022-02-10 DIAGNOSIS — R102 Pelvic and perineal pain: Secondary | ICD-10-CM | POA: Diagnosis not present

## 2022-02-10 DIAGNOSIS — N503 Cyst of epididymis: Secondary | ICD-10-CM | POA: Diagnosis not present

## 2022-06-05 ENCOUNTER — Encounter: Payer: Self-pay | Admitting: Internal Medicine

## 2022-06-05 ENCOUNTER — Ambulatory Visit: Payer: BC Managed Care – PPO | Admitting: Internal Medicine

## 2022-06-05 VITALS — BP 126/74 | HR 66 | Ht 71.0 in | Wt 170.0 lb

## 2022-06-05 DIAGNOSIS — E0500 Thyrotoxicosis with diffuse goiter without thyrotoxic crisis or storm: Secondary | ICD-10-CM

## 2022-06-05 DIAGNOSIS — Z23 Encounter for immunization: Secondary | ICD-10-CM

## 2022-06-05 DIAGNOSIS — E0590 Thyrotoxicosis, unspecified without thyrotoxic crisis or storm: Secondary | ICD-10-CM

## 2022-06-05 NOTE — Progress Notes (Unsigned)
Name: Marcus Shea  MRN/ DOB: [id number], 1965/11/05    Age/ Sex: 56 y.o., male     PCP: Marcus Pepper, MD   Reason for Endocrinology Evaluation: Hyperthyroidism     Initial Endocrinology Clinic Visit: 10/13/2020    PATIENT IDENTIFIER: [name] is a 56 y.o., male with a past medical history of Hyperthyroidism. He has followed with [facility] since 10/13/2020 for consultative assistance with management of his Hyperthyroidism.   HISTORICAL SUMMARY:   He has been diagnosed with hyperthyroidism in 09/2020 with a suppressed TSH < 0.01 uIU/mL an elevated FT4 at 2.99 ng/dL and T3 at 333 ng/dL , he was having tremors and diarrhea at the time.    He was started on methimazole and atenolol in 09/2020 TRAb  was elevated at 19.14 IU/L    Wife with Breast cancer , mother with metastatic breast ca   He is a Financial planner  SUBJECTIVE:    Today (06/05/2022):  Marcus Shea is here for a follow-up on hyperthyroidism .   Weight has been trending up  Denies constipation or diarrhea  Denies local neck swelling  Denies palpitations  Denies tremors Denies  burning and itching of the eyes Last eye exam 10/2021 Denies recent fever    Methimazole 5 mg, half a tablet three days a week ( Monday Wednesday and Friday )    HISTORY:  Past Medical History: No past medical history on file. Past Surgical History:  Social History:  reports that he has never smoked. He has quit using smokeless tobacco. He reports current alcohol use. No history on file for drug use. Family History:  Family History  Problem Relation Age of Onset   Breast cancer Mother    Diabetes Father      HOME MEDICATIONS: Allergies as of 06/05/2022   No Known Allergies      Medication List        Accurate as of June 05, 2022 11:41 AM. If you have any questions, ask your nurse or doctor.          methimazole 5 MG tablet Commonly known as: TAPAZOLE Take 0.5 tablets (2.5 mg  total) by mouth as directed. Half a tablet three days a week   naproxen 500 MG tablet Commonly known as: NAPROSYN Take 500 mg by mouth 2 (two) times daily.          OBJECTIVE:   PHYSICAL EXAM: VS: There were no vitals taken for this visit.   EXAM: General: Pt appears well and is in NAD  Eyes: External eye exam normal without stare, lid lag or exophthalmos.  EOM intact.  PERRL.  Neck: General: Supple without adenopathy. Thyroid: Thyroid size normal.  No goiter or nodules appreciated. No thyroid bruit.  Lungs: Clear with good BS bilat with no rales, rhonchi, or wheezes  Heart: Auscultation: RRR.  Abdomen: Normoactive bowel sounds, soft, nontender, without masses or organomegaly palpable  Extremities:  BL LE: No pretibial edema normal ROM and strength.  Mental Status: Judgment, insight: Intact Orientation: Oriented to time, place, and person Mood and affect: No depression, anxiety, or agitation     DATA REVIEWED:  Latest Reference Range & Units 11/24/21 15:12  Sodium 135 - 145 mEq/L 139  Potassium 3.5 - 5.1 mEq/L 4.2  Chloride 96 - 112 mEq/L 103  CO2 19 - 32 mEq/L 28  Glucose 70 - 99 mg/dL 85  BUN 6 - 23 mg/dL 14  Creatinine 0.40 - 1.50 mg/dL 1.16  Calcium 8.4 -  10.5 mg/dL 9.4  Alkaline Phosphatase 39 - 117 U/L 92  Albumin 3.5 - 5.2 g/dL 4.6  AST 0 - 37 U/L 23  ALT 0 - 53 U/L 18  Total Protein 6.0 - 8.3 g/dL 7.2  Total Bilirubin 0.2 - 1.2 mg/dL 0.6  GFR >60.00 mL/min 69.83  WBC 4.0 - 10.5 K/uL 6.7  RBC 4.22 - 5.81 Mil/uL 4.68  Hemoglobin 13.0 - 17.0 g/dL 14.8  HCT 39.0 - 52.0 % 44.1  MCV 78.0 - 100.0 fl 94.1  MCHC 30.0 - 36.0 g/dL 33.5  RDW 11.5 - 15.5 % 12.8  Platelets 150.0 - 400.0 K/uL 250.0  Neutrophils 43.0 - 77.0 % 55.4  Lymphocytes 12.0 - 46.0 % 30.7  Monocytes Relative 3.0 - 12.0 % 7.0  Eosinophil 0.0 - 5.0 % 6.4 (H)  Basophil 0.0 - 3.0 % 0.5  NEUT# 1.4 - 7.7 K/uL 3.7  Lymphocyte # 0.7 - 4.0 K/uL 2.1  Monocyte # 0.1 - 1.0 K/uL 0.5  Eosinophils  Absolute 0.0 - 0.7 K/uL 0.4  Basophils Absolute 0.0 - 0.1 K/uL 0.0    Latest Reference Range & Units 11/24/21 15:12  TSH 0.35 - 5.50 uIU/mL 1.83  Triiodothyronine (T3) 76 - 181 ng/dL 79  T4,Free(Direct) 0.60 - 1.60 ng/dL 0.90       ASSESSMENT / PLAN / RECOMMENDATIONS:   Hyperthyroidism Secondary to Graves' Disease   - Pt is clinically euthyroid - No local neck symptoms  - TFT;s normal   Medications   Continue  Methimazole 5 mg,half a tablet ( Monday Wednesday and Friday )    2. Graves' Disease:   - No extra thyroidal manifestations of Graves' disease   F/u 6 months    Signed electronically by: Marcus Guise, MD  [facility]  [facility] [address] [place], [place] [address] Phone: [phone] FAX: [phone]      CC: Marcus Pepper, MD [place] [address] Phone: [phone]  Fax: [phone]   Return to Endocrinology clinic as below: Future Appointments  Date Time Provider [place]  06/05/2022  3:00 PM Marcus Shea, Marcus Crazier, MD [facility] None

## 2022-06-06 ENCOUNTER — Telehealth: Payer: Self-pay | Admitting: Internal Medicine

## 2022-06-06 LAB — T3: T3, Total: 80 ng/dL (ref 76–181)

## 2022-06-06 LAB — T4, FREE: Free T4: 0.95 ng/dL (ref 0.60–1.60)

## 2022-06-06 LAB — TSH: TSH: 2.15 u[IU]/mL (ref 0.35–5.50)

## 2022-06-06 MED ORDER — METHIMAZOLE 5 MG PO TABS: 2.5000 mg | ORAL_TABLET | ORAL | 3 refills | Status: DC

## 2022-06-06 NOTE — Telephone Encounter (Signed)
Patient notified and will make medication change

## 2022-06-06 NOTE — Telephone Encounter (Signed)
Please let the pt know that his thyroid is normal and to reduce it to half a tablet twice weekly ( instead of half a tablet three times a  week)     Thanks

## 2022-11-07 DIAGNOSIS — R7309 Other abnormal glucose: Secondary | ICD-10-CM | POA: Diagnosis not present

## 2022-11-07 DIAGNOSIS — Z13220 Encounter for screening for lipoid disorders: Secondary | ICD-10-CM | POA: Diagnosis not present

## 2022-11-07 DIAGNOSIS — Z0000 Encounter for general adult medical examination without abnormal findings: Secondary | ICD-10-CM | POA: Diagnosis not present

## 2022-11-07 DIAGNOSIS — Z8719 Personal history of other diseases of the digestive system: Secondary | ICD-10-CM | POA: Diagnosis not present

## 2022-11-07 DIAGNOSIS — Z125 Encounter for screening for malignant neoplasm of prostate: Secondary | ICD-10-CM | POA: Diagnosis not present

## 2022-12-06 ENCOUNTER — Ambulatory Visit: Payer: BC Managed Care – PPO | Admitting: Internal Medicine

## 2022-12-06 ENCOUNTER — Encounter: Payer: Self-pay | Admitting: Internal Medicine

## 2022-12-06 VITALS — BP 126/78 | HR 76 | Ht 71.0 in | Wt 160.4 lb

## 2022-12-06 DIAGNOSIS — E0590 Thyrotoxicosis, unspecified without thyrotoxic crisis or storm: Secondary | ICD-10-CM | POA: Diagnosis not present

## 2022-12-06 DIAGNOSIS — E0500 Thyrotoxicosis with diffuse goiter without thyrotoxic crisis or storm: Secondary | ICD-10-CM | POA: Diagnosis not present

## 2022-12-06 NOTE — Progress Notes (Unsigned)
Name: Marcus Shea  MRN/ DOB: [id number], 1965/08/20    Age/ Sex: 57 y.o., male     PCP: Farris Has, MD   Reason for Endocrinology Evaluation: Hyperthyroidism     Initial Endocrinology Clinic Visit: 10/13/2020    PATIENT IDENTIFIER: Marcus Shea is a 57 y.o., male with a past medical history of Hyperthyroidism. He has followed with [facility] since 10/13/2020 for consultative assistance with management of his Hyperthyroidism.   HISTORICAL SUMMARY:   He has been diagnosed with hyperthyroidism in 09/2020 with a suppressed TSH < 0.01 uIU/mL an elevated FT4 at 2.99 ng/dL and T3 at 409 ng/dL , he was having tremors and diarrhea at the time.    He was started on methimazole and atenolol in 09/2020 TRAb  was elevated at 19.14 IU/L    Wife with Breast cancer , mother with metastatic breast ca   He is a Sport and exercise psychologist  SUBJECTIVE:    Today (12/06/2022):  Marcus Shea is here for a follow-up on hyperthyroidism .   Weight has been decreasing which is intentional  Denies constipation or diarrhea  Denies local neck swelling  Denies palpitations  Denies tremors He has burning and itching of the eyes that he attributes to allergies  Wife in remission from breast cancer  Mother with metastatic breast cancer    Last eye exam 10/2021   Methimazole 5 mg, half a tablet twice a week    HISTORY:  Past Medical History: No past medical history on file. Past Surgical History:  Social History:  reports that he has never smoked. He has quit using smokeless tobacco. He reports current alcohol use. No history on file for drug use. Family History:  Family History  Problem Relation Age of Onset   Breast cancer Mother    Diabetes Father      HOME MEDICATIONS: Allergies as of 12/06/2022   No Known Allergies      Medication List        Accurate as of Dec 06, 2022  3:01 PM. If you have any questions, ask your nurse or doctor.          loratadine 10 MG  tablet Commonly known as: CLARITIN Take 10 mg by mouth daily.   methimazole 5 MG tablet Commonly known as: TAPAZOLE Take 0.5 tablets (2.5 mg total) by mouth as directed. Half a tablet two days a week   naproxen 500 MG tablet Commonly known as: NAPROSYN Take 500 mg by mouth 2 (two) times daily.          OBJECTIVE:   PHYSICAL EXAM: VS: BP 126/78 (BP Location: Left Arm, Patient Position: Sitting, Cuff Size: Large)   Pulse 76   Ht 5\' 11"  (1.803 m)   Wt 160 lb 6.4 oz (72.8 kg)   SpO2 99%   BMI 22.37 kg/m    EXAM: General: Pt appears well and is in NAD  Eyes: External eye exam normal without stare, lid lag or exophthalmos.   Neck: General: Supple without adenopathy. Thyroid: Thyroid size normal.  No goiter or nodules appreciated.   Lungs: Clear with good BS bilat with no rales, rhonchi, or wheezes  Heart: Auscultation: RRR.  Abdomen: Normoactive bowel sounds, soft, nontender, without masses or organomegaly palpable  Extremities:  BL LE: No pretibial edema  Mental Status: Judgment, insight: Intact Orientation: Oriented to time, place, and person Mood and affect: No depression, anxiety, or agitation     DATA REVIEWED:   Latest Reference Range & Units  06/05/22 15:04  TSH 0.35 - 5.50 uIU/mL 2.15  Triiodothyronine (T3) 76 - 181 ng/dL 80  Y7,WGNF(AOZHYQ) 6.57 - 1.60 ng/dL 8.46      Latest Reference Range & Units 11/24/21 15:12  Sodium 135 - 145 mEq/L 139  Potassium 3.5 - 5.1 mEq/L 4.2  Chloride 96 - 112 mEq/L 103  CO2 19 - 32 mEq/L 28  Glucose 70 - 99 mg/dL 85  BUN 6 - 23 mg/dL 14  Creatinine 9.62 - 9.52 mg/dL 8.41  Calcium 8.4 - 32.4 mg/dL 9.4  Alkaline Phosphatase 39 - 117 U/L 92  Albumin 3.5 - 5.2 g/dL 4.6  AST 0 - 37 U/L 23  ALT 0 - 53 U/L 18  Total Protein 6.0 - 8.3 g/dL 7.2  Total Bilirubin 0.2 - 1.2 mg/dL 0.6  GFR >40.10 mL/min 69.83  WBC 4.0 - 10.5 K/uL 6.7  RBC 4.22 - 5.81 Mil/uL 4.68  Hemoglobin 13.0 - 17.0 g/dL 27.2  HCT 53.6 - 64.4 % 44.1   MCV 78.0 - 100.0 fl 94.1  MCHC 30.0 - 36.0 g/dL 03.4  RDW 74.2 - 59.5 % 12.8  Platelets 150.0 - 400.0 K/uL 250.0  Neutrophils 43.0 - 77.0 % 55.4  Lymphocytes 12.0 - 46.0 % 30.7  Monocytes Relative 3.0 - 12.0 % 7.0  Eosinophil 0.0 - 5.0 % 6.4 (H)  Basophil 0.0 - 3.0 % 0.5  NEUT# 1.4 - 7.7 K/uL 3.7  Lymphocyte # 0.7 - 4.0 K/uL 2.1  Monocyte # 0.1 - 1.0 K/uL 0.5  Eosinophils Absolute 0.0 - 0.7 K/uL 0.4  Basophils Absolute 0.0 - 0.1 K/uL 0.0    Latest Reference Range & Units 11/24/21 15:12  TSH 0.35 - 5.50 uIU/mL 1.83  Triiodothyronine (T3) 76 - 181 ng/dL 79  G3,OVFI(EPPIRJ) 1.88 - 1.60 ng/dL 4.16       ASSESSMENT / PLAN / RECOMMENDATIONS:   Hyperthyroidism Secondary to Graves' Disease   - Pt is clinically euthyroid - No local neck symptoms  - TFT;s normal   Medications   Decrease  Methimazole 5 mg,half a tablet twice weekly     2. Graves' Disease:   - No extra thyroidal manifestations of Graves' disease   F/u 6 months    Signed electronically by: Lyndle Herrlich, MD  [facility]  [facility] [address] [place] [place] [place], [place] [address] Phone: [phone] FAX: [phone]      CC: Farris Has, MD [address] [place] [address] Phone: [phone]  Fax: [phone]   Return to Endocrinology clinic as below: No future appointments.

## 2022-12-07 ENCOUNTER — Telehealth: Payer: Self-pay | Admitting: Internal Medicine

## 2022-12-07 LAB — T4, FREE: Free T4: 0.99 ng/dL (ref 0.60–1.60)

## 2022-12-07 LAB — TSH: TSH: 1.89 u[IU]/mL (ref 0.35–5.50)

## 2022-12-07 NOTE — Telephone Encounter (Signed)
Please asked the patient to STOP  methimazole, his thyroid function is normal  Thanks

## 2022-12-08 NOTE — Telephone Encounter (Signed)
LMTCB

## 2022-12-08 NOTE — Telephone Encounter (Signed)
Patient has been advised

## 2023-02-05 ENCOUNTER — Other Ambulatory Visit (INDEPENDENT_AMBULATORY_CARE_PROVIDER_SITE_OTHER): Payer: BC Managed Care – PPO

## 2023-02-05 DIAGNOSIS — E0500 Thyrotoxicosis with diffuse goiter without thyrotoxic crisis or storm: Secondary | ICD-10-CM | POA: Diagnosis not present

## 2023-02-05 LAB — TSH: TSH: 2.04 u[IU]/mL (ref 0.35–5.50)

## 2023-02-05 LAB — T4, FREE: Free T4: 0.94 ng/dL (ref 0.60–1.60)

## 2023-02-06 ENCOUNTER — Telehealth: Payer: Self-pay | Admitting: Internal Medicine

## 2023-02-06 NOTE — Telephone Encounter (Signed)
Please let the patient know that his thyroid function remains within normal range.   I had stopped his methimazole in May, he needs to stay off of it as it is not needed   [employer]

## 2023-02-06 NOTE — Telephone Encounter (Signed)
Marcus Shea is aware of recommendation and results

## 2023-02-20 DIAGNOSIS — Z125 Encounter for screening for malignant neoplasm of prostate: Secondary | ICD-10-CM | POA: Diagnosis not present

## 2023-02-26 DIAGNOSIS — Z8042 Family history of malignant neoplasm of prostate: Secondary | ICD-10-CM | POA: Diagnosis not present

## 2023-02-26 DIAGNOSIS — R102 Pelvic and perineal pain: Secondary | ICD-10-CM | POA: Diagnosis not present

## 2023-02-26 DIAGNOSIS — D2930 Benign neoplasm of unspecified epididymis: Secondary | ICD-10-CM | POA: Diagnosis not present

## 2023-02-26 DIAGNOSIS — Z125 Encounter for screening for malignant neoplasm of prostate: Secondary | ICD-10-CM | POA: Diagnosis not present

## 2023-03-15 DIAGNOSIS — D1801 Hemangioma of skin and subcutaneous tissue: Secondary | ICD-10-CM | POA: Diagnosis not present

## 2023-03-15 DIAGNOSIS — D2371 Other benign neoplasm of skin of right lower limb, including hip: Secondary | ICD-10-CM | POA: Diagnosis not present

## 2023-03-15 DIAGNOSIS — D485 Neoplasm of uncertain behavior of skin: Secondary | ICD-10-CM | POA: Diagnosis not present

## 2023-03-15 DIAGNOSIS — L821 Other seborrheic keratosis: Secondary | ICD-10-CM | POA: Diagnosis not present

## 2023-03-15 DIAGNOSIS — L578 Other skin changes due to chronic exposure to nonionizing radiation: Secondary | ICD-10-CM | POA: Diagnosis not present

## 2023-03-15 DIAGNOSIS — L814 Other melanin hyperpigmentation: Secondary | ICD-10-CM | POA: Diagnosis not present

## 2023-05-19 IMAGING — US US SCROTUM W/ DOPPLER COMPLETE
1 series · 14 of 25 positions shown · non-contrast
Comparison: 10/18/2020

CLINICAL DATA: Benign neoplasm of epididymis, unspecified
laterality. ASSESS EPIDIDYMAL LESION

EXAM:
SCROTAL ULTRASOUND
DOPPLER ULTRASOUND OF THE TESTICLES
TECHNIQUE: Complete ultrasound examination of the testicles, epididymis, and
other scrotal structures was performed. Color and spectral Doppler
ultrasound were also utilized to evaluate blood flow to the
testicles.

[Series 1: us scrotum w/ doppler complete · 0.06mm/px · 59 acquisitions, 14 frames shown]
[im 1/59]
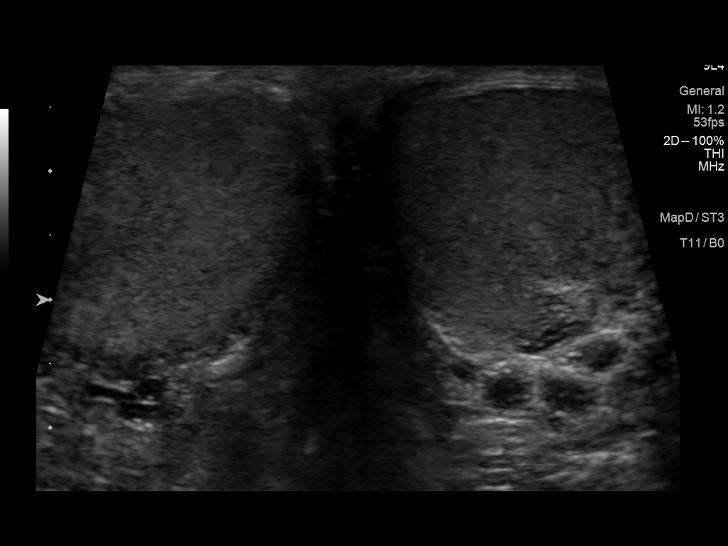
[im 5/59]
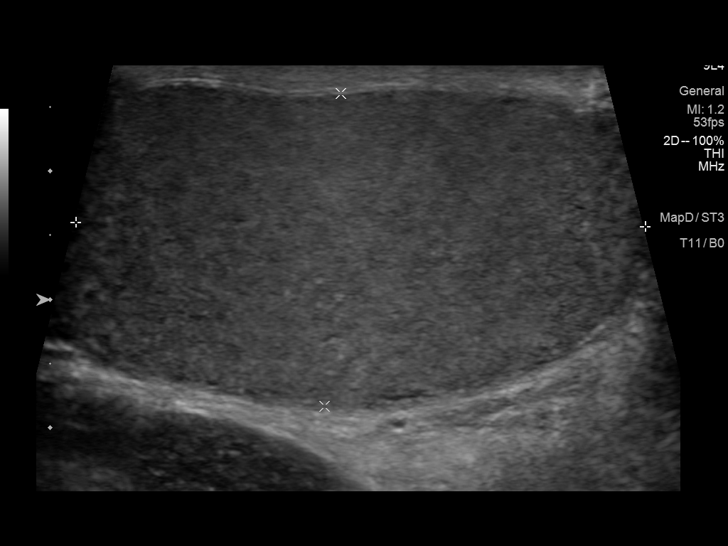
[im 10/59]
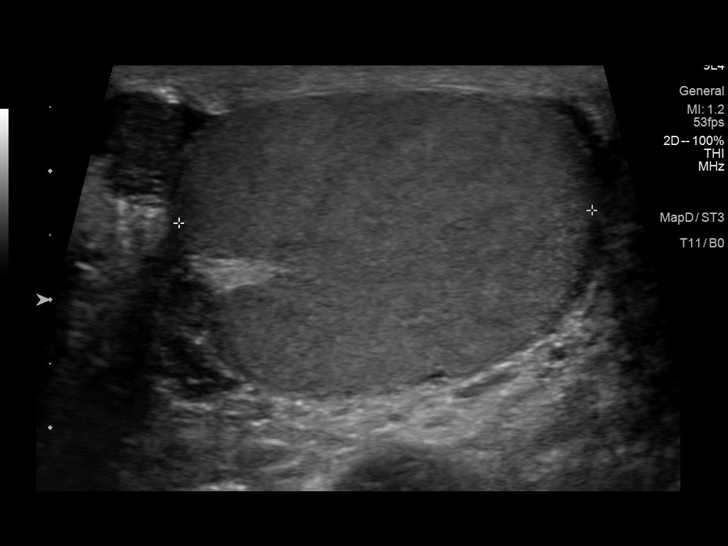
[im 15/59]
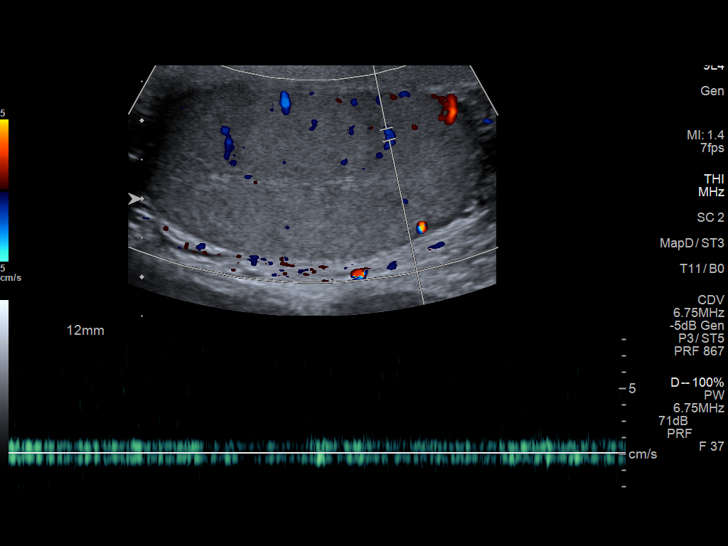
[im 20/59]
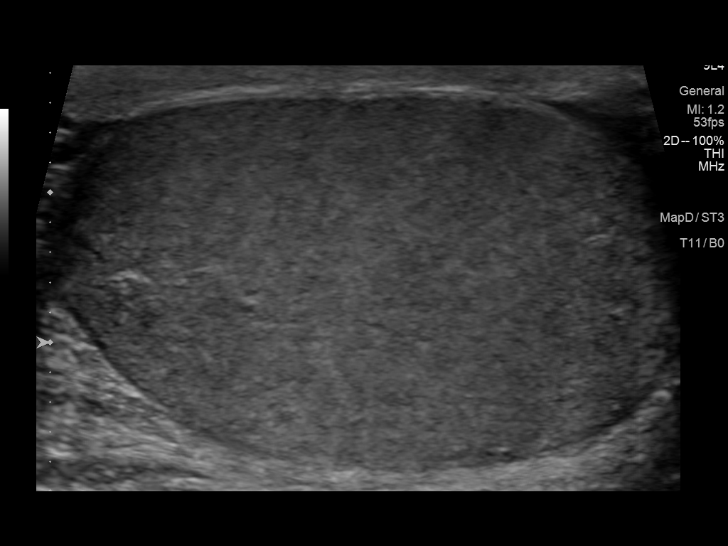
[im 22/59]
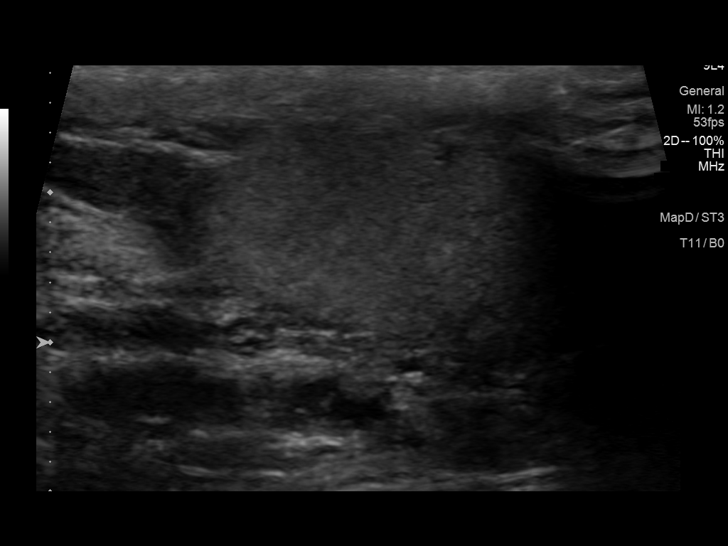
[im 27/59]
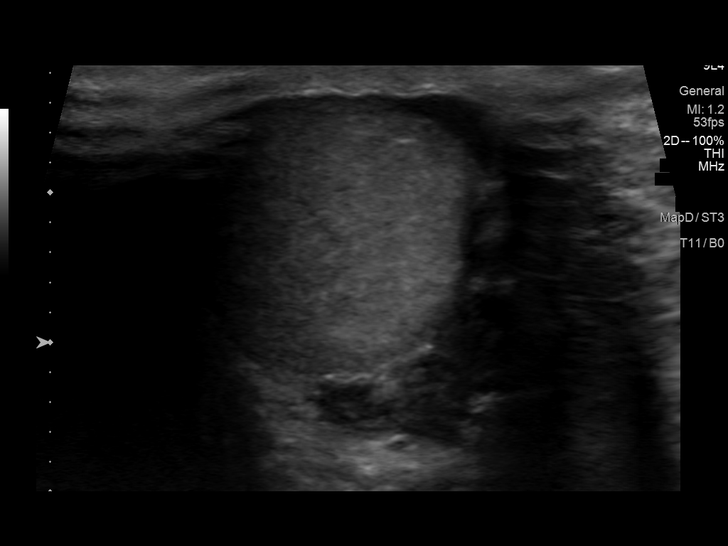
[im 32/59]
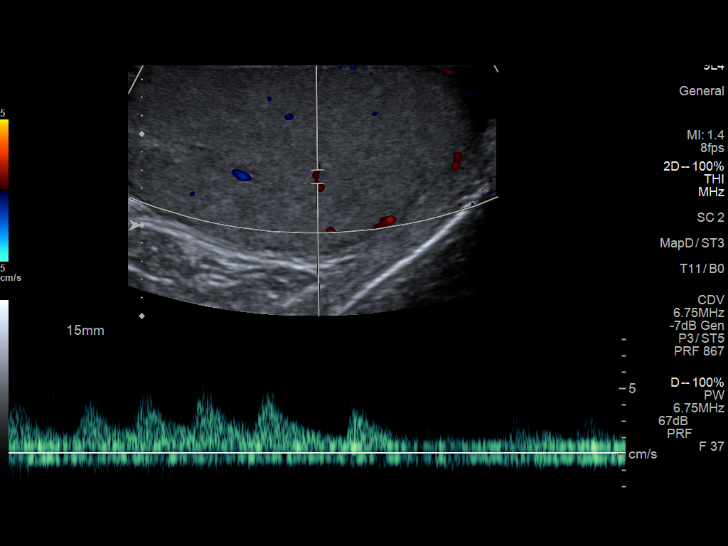
[im 37/59]
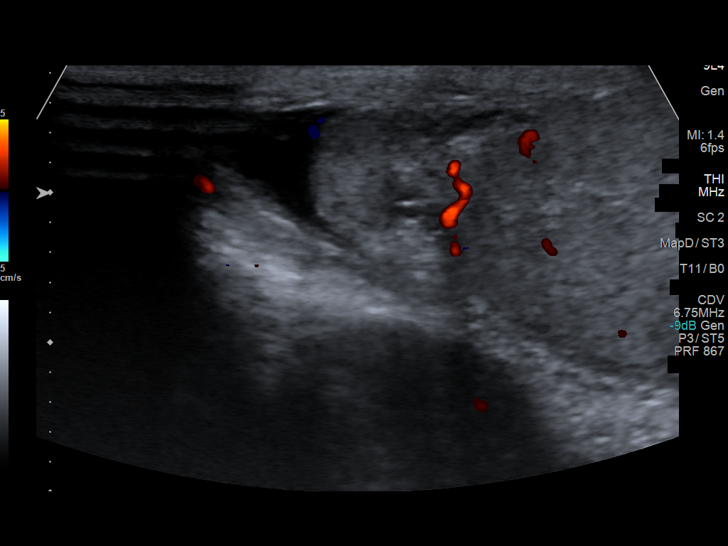
[im 39/59]
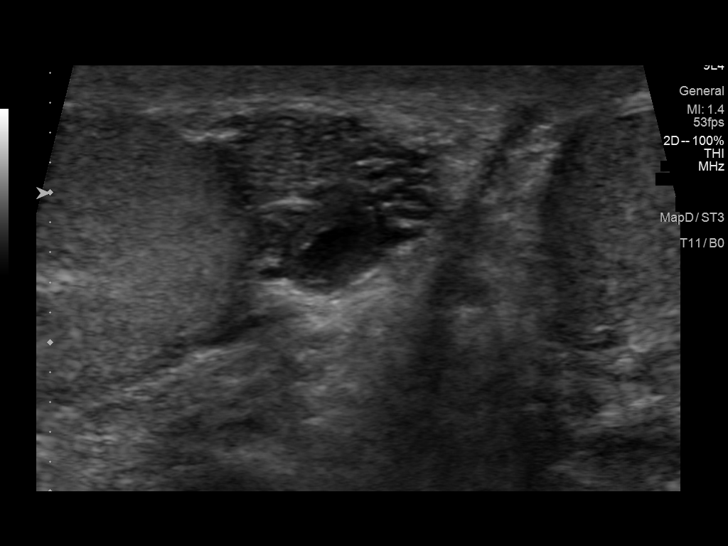
[im 44/59]
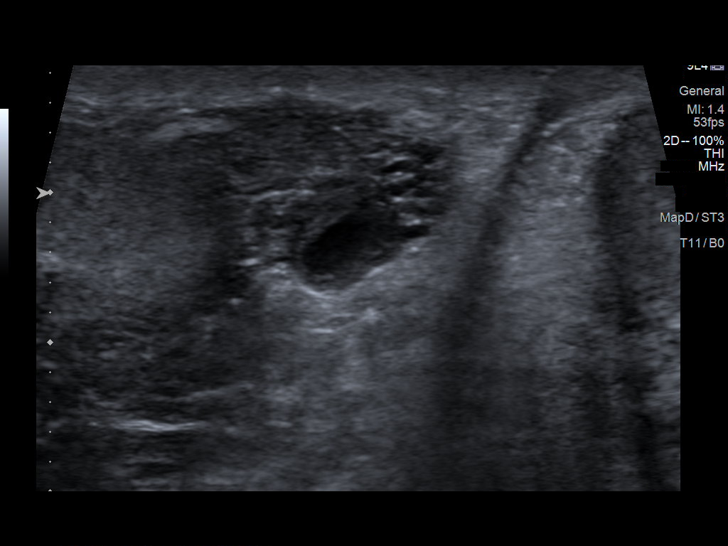
[im 49/59]
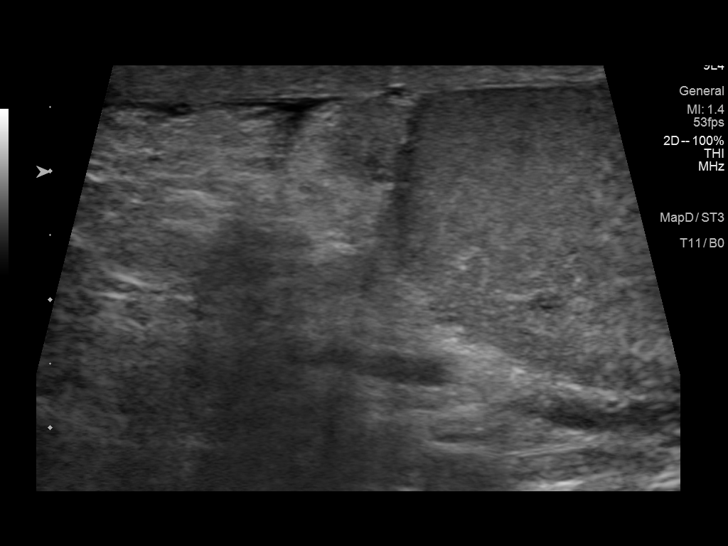
[im 54/59]
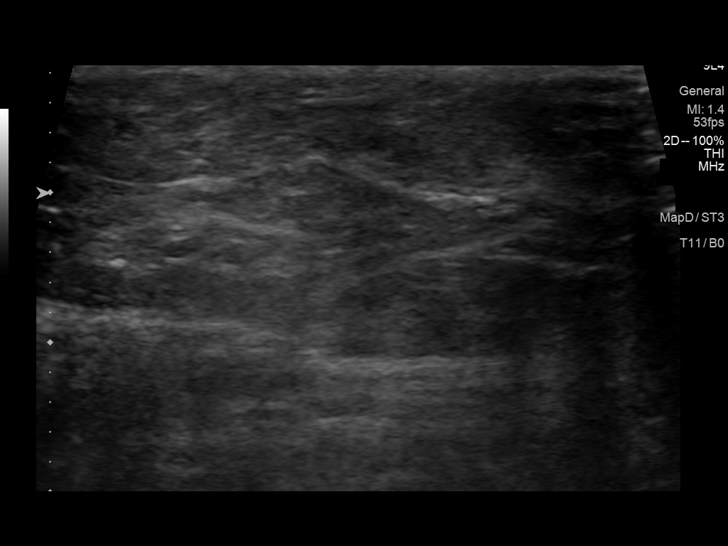
[im 59/59]
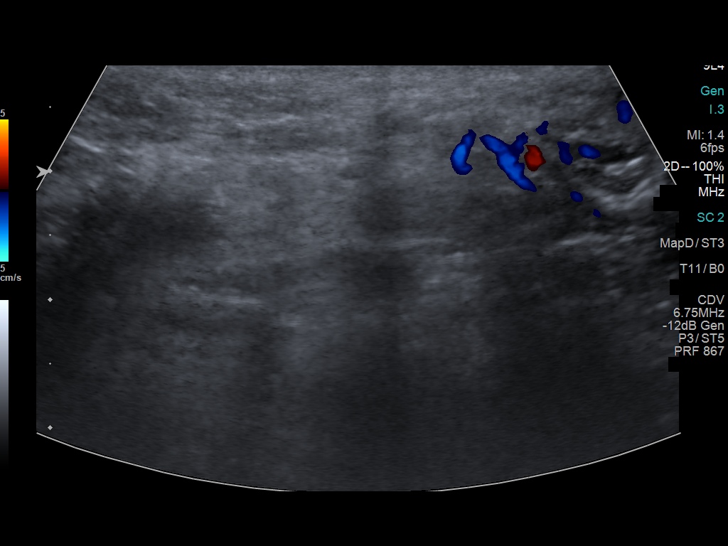

[14 of 25 positions shown; findings below may reference images not displayed]

FINDINGS: Right testicle

Measurements: 4.4 x 2.4 x 3.2 cm. No mass or microlithiasis
visualized.

Left testicle

Measurements: 4.1 x 2.5 x 3.1 cm. No mass or microlithiasis
visualized.

Right epididymis: Hypoechoic lesion noted within the epididymal tail
measures 8 x 6 x 5 mm, stable in size and appearance since prior
study.

Left epididymis:  Normal in size and appearance.

Hydrocele:  None visualized.

Varicocele:  None visualized.

Pulsed Doppler interrogation of both testes demonstrates normal low
resistance arterial and venous waveforms bilaterally.
IMPRESSION: Stable 8 mm hypoechoic lesion within the epididymal tail. This could
be followed with repeat ultrasound in 6-12 months to ensure
stability.

No testicular abnormality.

## 2023-05-25 ENCOUNTER — Encounter: Payer: Self-pay | Admitting: Internal Medicine

## 2023-05-25 ENCOUNTER — Ambulatory Visit: Payer: BC Managed Care – PPO | Admitting: Internal Medicine

## 2023-05-25 VITALS — BP 120/80 | HR 78 | Ht 71.0 in | Wt 164.0 lb

## 2023-05-25 DIAGNOSIS — E0500 Thyrotoxicosis with diffuse goiter without thyrotoxic crisis or storm: Secondary | ICD-10-CM | POA: Diagnosis not present

## 2023-05-25 DIAGNOSIS — E0590 Thyrotoxicosis, unspecified without thyrotoxic crisis or storm: Secondary | ICD-10-CM

## 2023-05-25 DIAGNOSIS — Z23 Encounter for immunization: Secondary | ICD-10-CM

## 2023-05-25 NOTE — Progress Notes (Unsigned)
Name: Keyur Novotny  MRN/ DOB: [id number], [birth year]    Age/ Sex: 57 y.o., male     PCP: Farris Has, MD   Reason for Endocrinology Evaluation: Hyperthyroidism     Initial Endocrinology Clinic Visit: 10/13/2020    PATIENT IDENTIFIER: Marcus Shea is a 57 y.o., male with a past medical history of Hyperthyroidism. He has followed with [facility] since 10/13/2020 for consultative assistance with management of his Hyperthyroidism.   HISTORICAL SUMMARY:   He has been diagnosed with hyperthyroidism in 09/2020 with a suppressed TSH < 0.01 uIU/mL an elevated FT4 at 2.99 ng/dL and T3 at 409 ng/dL , he was having tremors and diarrhea at the time.    He was started on methimazole and atenolol in 09/2020 TRAb  was elevated at 19.14 IU/L    He is a Sport and exercise psychologist  Methimazole was discontinued 11/2022   SUBJECTIVE:    Today (05/25/2023):  Marcus Shea is here for a follow-up on hyperthyroidism .   Weight gain noted  Denies Palpitations  No local neck swelling  Has noted rare jittery sensation  Denies constipation or diarrhea      HISTORY:  Past Medical History: No past medical history on file. Past Surgical History:  Social History:  reports that he has never smoked. He has quit using smokeless tobacco. He reports current alcohol use. No history on file for drug use. Family History:  Family History  Problem Relation Age of Onset   Breast cancer Mother    Diabetes Father      HOME MEDICATIONS: Allergies as of 05/25/2023   No Known Allergies      Medication List        Accurate as of May 25, 2023  8:12 AM. If you have any questions, ask your nurse or doctor.          loratadine 10 MG tablet Commonly known as: CLARITIN Take 10 mg by mouth daily.   naproxen 500 MG tablet Commonly known as: NAPROSYN Take 500 mg by mouth 2 (two) times daily.          OBJECTIVE:   PHYSICAL EXAM: VS: There were no vitals taken for this  visit.   EXAM: General: Pt appears well and is in NAD  Eyes: External eye exam normal without stare, lid lag or exophthalmos.   Neck: General: Supple without adenopathy. Thyroid: Thyroid size normal.  No goiter or nodules appreciated.   Lungs: Clear with good BS bilat with no rales, rhonchi, or wheezes  Heart: Auscultation: RRR.  Abdomen: Normoactive bowel sounds, soft, nontender, without masses or organomegaly palpable  Extremities:  BL LE: No pretibial edema  Mental Status: Judgment, insight: Intact Orientation: Oriented to time, place, and person Mood and affect: No depression, anxiety, or agitation     DATA REVIEWED:        ASSESSMENT / PLAN / RECOMMENDATIONS:   Hyperthyroidism Secondary to Graves' Disease   - Pt is clinically euthyroid - No local neck symptoms  -Has been off methimazole since 11/2022 -Reviewed symptoms of hyperthyroidism and to contact us sooner for evaluation -Encourage serial ophthalmological exam -TFTs ***  Medications    2. Graves' Disease:   - No extra thyroidal manifestations of Graves' disease -We discussed the importance of managing stress is much as possible as it is a triggering factor to Graves' disease   F/u 1 yr   Signed electronically by: Lyndle Herrlich, MD   Endocrinology  [facility] [address] [place], [place] [address] Phone: [phone] FAX: [phone]      CC: Farris Has, MD [address] [place] [address] Phone: [phone]  Fax: [phone]   Return to Endocrinology clinic as below: Future Appointments  Date Time Provider Department Center  05/25/2023  2:40 PM Joanny Dupree, Konrad Dolores, MD [facility] None

## 2023-05-26 LAB — TSH: TSH: 3.14 m[IU]/L (ref 0.40–4.50)

## 2023-05-26 LAB — T3, FREE: T3, Free: 3 pg/mL (ref 2.3–4.2)

## 2023-05-26 LAB — T4, FREE: Free T4: 1.3 ng/dL (ref 0.8–1.8)

## 2023-05-28 ENCOUNTER — Encounter: Payer: Self-pay | Admitting: Internal Medicine

## 2023-06-08 ENCOUNTER — Ambulatory Visit: Payer: BC Managed Care – PPO | Admitting: Internal Medicine

## 2023-09-07 ENCOUNTER — Other Ambulatory Visit: Payer: Self-pay | Admitting: Urology

## 2023-09-07 DIAGNOSIS — R972 Elevated prostate specific antigen [PSA]: Secondary | ICD-10-CM

## 2023-10-19 ENCOUNTER — Ambulatory Visit
Admission: RE | Admit: 2023-10-19 | Discharge: 2023-10-19 | Disposition: A | Discharge status: Home / Self Care | Payer: BC Managed Care – PPO | Source: Ambulatory Visit | Attending: Urology | Admitting: Urology

## 2023-10-19 DIAGNOSIS — R972 Elevated prostate specific antigen [PSA]: Secondary | ICD-10-CM

## 2023-10-19 MED ORDER — GADOPICLENOL 0.5 MMOL/ML IV SOLN
8.0000 mL | Freq: Once | INTRAVENOUS | Status: AC | PRN
  Administered 2023-10-19: 8 mL via INTRAVENOUS

## 2024-07-02 DIAGNOSIS — Z860101 Personal history of adenomatous and serrated colon polyps: Secondary | ICD-10-CM | POA: Diagnosis not present

## 2024-07-02 DIAGNOSIS — K5730 Diverticulosis of large intestine without perforation or abscess without bleeding: Secondary | ICD-10-CM | POA: Diagnosis not present

## 2024-07-02 DIAGNOSIS — K648 Other hemorrhoids: Secondary | ICD-10-CM | POA: Diagnosis not present

## 2024-07-02 DIAGNOSIS — D122 Benign neoplasm of ascending colon: Secondary | ICD-10-CM | POA: Diagnosis not present

## 2024-07-02 DIAGNOSIS — Z09 Encounter for follow-up examination after completed treatment for conditions other than malignant neoplasm: Secondary | ICD-10-CM | POA: Diagnosis not present

## 2024-07-02 DIAGNOSIS — K644 Residual hemorrhoidal skin tags: Secondary | ICD-10-CM | POA: Diagnosis not present
